# Patient Record
Sex: Female | Born: 1985 | Race: White | Hispanic: No | Marital: Married | Smoking: Never smoker
Health system: Southern US, Community
[De-identification: ages and names within clinical notes are randomized; demographics above are authoritative.]

## PROBLEM LIST (undated history)

## (undated) DIAGNOSIS — O139 Gestational [pregnancy-induced] hypertension without significant proteinuria, unspecified trimester: Secondary | ICD-10-CM

## (undated) DIAGNOSIS — E069 Thyroiditis, unspecified: Secondary | ICD-10-CM

## (undated) DIAGNOSIS — Q513 Bicornate uterus: Secondary | ICD-10-CM

## (undated) HISTORY — PX: WISDOM TOOTH EXTRACTION: SHX21

---

## 2015-08-19 ENCOUNTER — Encounter (HOSPITAL_COMMUNITY): Payer: Self-pay | Admitting: *Deleted

## 2015-08-19 ENCOUNTER — Inpatient Hospital Stay (HOSPITAL_COMMUNITY)
Admission: AD | Admit: 2015-08-19 | Discharge: 2015-08-22 | DRG: 766 | Disposition: A | Discharge status: Home / Self Care | Payer: BLUE CROSS/BLUE SHIELD | Source: Ambulatory Visit | Attending: Obstetrics and Gynecology | Admitting: Obstetrics and Gynecology

## 2015-08-19 ENCOUNTER — Inpatient Hospital Stay (HOSPITAL_COMMUNITY): Payer: BLUE CROSS/BLUE SHIELD | Admitting: Anesthesiology

## 2015-08-19 ENCOUNTER — Inpatient Hospital Stay (HOSPITAL_COMMUNITY)
Admission: AD | Disposition: A | Discharge status: Home / Self Care | Payer: BLUE CROSS/BLUE SHIELD | Source: Ambulatory Visit | Attending: Obstetrics and Gynecology

## 2015-08-19 DIAGNOSIS — O329XX0 Maternal care for malpresentation of fetus, unspecified, not applicable or unspecified: Secondary | ICD-10-CM | POA: Diagnosis present

## 2015-08-19 DIAGNOSIS — O139 Gestational [pregnancy-induced] hypertension without significant proteinuria, unspecified trimester: Secondary | ICD-10-CM | POA: Diagnosis present

## 2015-08-19 DIAGNOSIS — Z3A38 38 weeks gestation of pregnancy: Secondary | ICD-10-CM | POA: Diagnosis present

## 2015-08-19 DIAGNOSIS — O133 Gestational [pregnancy-induced] hypertension without significant proteinuria, third trimester: Principal | ICD-10-CM | POA: Diagnosis present

## 2015-08-19 DIAGNOSIS — O34219 Maternal care for unspecified type scar from previous cesarean delivery: Secondary | ICD-10-CM

## 2015-08-19 HISTORY — DX: Thyroiditis, unspecified: E06.9

## 2015-08-19 LAB — CBC
HCT: 35.2 % — ABNORMAL LOW (ref 36.0–46.0)
HEMOGLOBIN: 12.4 g/dL (ref 12.0–15.0)
MCH: 29.7 pg (ref 26.0–34.0)
MCHC: 35.2 g/dL (ref 30.0–36.0)
MCV: 84.4 fL (ref 78.0–100.0)
PLATELETS: 225 10*3/uL (ref 150–400)
RBC: 4.17 MIL/uL (ref 3.87–5.11)
RDW: 12.9 % (ref 11.5–15.5)
WBC: 8.6 10*3/uL (ref 4.0–10.5)

## 2015-08-19 LAB — TYPE AND SCREEN
ABO/RH(D): O POS
ANTIBODY SCREEN: NEGATIVE

## 2015-08-19 LAB — COMPREHENSIVE METABOLIC PANEL
ALK PHOS: 230 U/L — AB (ref 38–126)
ALT: 22 U/L (ref 14–54)
AST: 31 U/L (ref 15–41)
Albumin: 3.5 g/dL (ref 3.5–5.0)
Anion gap: 9 (ref 5–15)
BUN: 12 mg/dL (ref 6–20)
CALCIUM: 9.3 mg/dL (ref 8.9–10.3)
CHLORIDE: 104 mmol/L (ref 101–111)
CO2: 21 mmol/L — AB (ref 22–32)
CREATININE: 0.49 mg/dL (ref 0.44–1.00)
GFR calc Af Amer: >60 mL/min (ref >60)
GFR calc non Af Amer: >60 mL/min (ref >60)
GLUCOSE: 87 mg/dL (ref 65–99)
Potassium: 3.7 mmol/L (ref 3.5–5.1)
SODIUM: 134 mmol/L — AB (ref 135–145)
Total Bilirubin: 0.5 mg/dL (ref 0.3–1.2)
Total Protein: 7 g/dL (ref 6.5–8.1)

## 2015-08-19 LAB — PROTEIN / CREATININE RATIO, URINE
Creatinine, Urine: 71 mg/dL
Total Protein, Urine: <6 mg/dL

## 2015-08-19 SURGERY — Surgical Case
Anesthesia: Spinal

## 2015-08-19 MED ORDER — BUPIVACAINE HCL (PF) 0.5 % IJ SOLN: Freq: Once | INTRAMUSCULAR | Status: DC

## 2015-08-19 MED ORDER — NALBUPHINE HCL 10 MG/ML IJ SOLN
5.0000 mg | INTRAMUSCULAR | Status: DC | PRN
  Filled 2015-08-19: qty 0.5

## 2015-08-19 MED ORDER — SCOPOLAMINE 1 MG/3DAYS TD PT72
MEDICATED_PATCH | TRANSDERMAL | Status: DC | PRN
  Administered 2015-08-19: 1 via TRANSDERMAL

## 2015-08-19 MED ORDER — OXYTOCIN 40 UNITS IN LACTATED RINGERS INFUSION - SIMPLE MED: 62.5000 mL/h | INTRAVENOUS | Status: AC

## 2015-08-19 MED ORDER — TETANUS-DIPHTH-ACELL PERTUSSIS 5-2.5-18.5 LF-MCG/0.5 IM SUSP: 0.5000 mL | Freq: Once | INTRAMUSCULAR | Status: DC

## 2015-08-19 MED ORDER — KETOROLAC TROMETHAMINE 30 MG/ML IJ SOLN
INTRAMUSCULAR | Status: AC
  Filled 2015-08-19: qty 1

## 2015-08-19 MED ORDER — DIPHENHYDRAMINE HCL 25 MG PO CAPS: 25.0000 mg | ORAL_CAPSULE | ORAL | Status: DC | PRN

## 2015-08-19 MED ORDER — KETOROLAC TROMETHAMINE 30 MG/ML IJ SOLN
30.0000 mg | Freq: Three times a day (TID) | INTRAMUSCULAR | Status: DC | PRN
  Administered 2015-08-19: 30 mg via INTRAVENOUS

## 2015-08-19 MED ORDER — DIPHENHYDRAMINE HCL 25 MG PO CAPS: 25.0000 mg | ORAL_CAPSULE | Freq: Four times a day (QID) | ORAL | Status: DC | PRN

## 2015-08-19 MED ORDER — WITCH HAZEL-GLYCERIN EX PADS
1.0000 | MEDICATED_PAD | CUTANEOUS | Status: DC | PRN
Start: 2015-08-19 — End: 2015-08-22

## 2015-08-19 MED ORDER — KETOROLAC TROMETHAMINE 30 MG/ML IJ SOLN: 30.0000 mg | Freq: Four times a day (QID) | INTRAMUSCULAR | Status: AC | PRN

## 2015-08-19 MED ORDER — BUPIVACAINE LIPOSOME 1.3 % IJ SUSP
20.0000 mL | Freq: Once | INTRAMUSCULAR | Status: DC
  Filled 2015-08-19: qty 20

## 2015-08-19 MED ORDER — LACTATED RINGERS IV SOLN
INTRAVENOUS | Status: DC
  Administered 2015-08-19 (×2): via INTRAVENOUS

## 2015-08-19 MED ORDER — CITRIC ACID-SODIUM CITRATE 334-500 MG/5ML PO SOLN: 30.0000 mL | Freq: Once | ORAL | Status: DC

## 2015-08-19 MED ORDER — DIPHENHYDRAMINE HCL 50 MG/ML IJ SOLN: 12.5000 mg | INTRAMUSCULAR | Status: DC | PRN

## 2015-08-19 MED ORDER — SENNOSIDES-DOCUSATE SODIUM 8.6-50 MG PO TABS
2.0000 | ORAL_TABLET | ORAL | Status: DC
  Administered 2015-08-21 – 2015-08-22 (×2): 2 via ORAL
  Filled 2015-08-19 (×2): qty 2

## 2015-08-19 MED ORDER — OXYTOCIN 10 UNIT/ML IJ SOLN
40.0000 [IU] | INTRAVENOUS | Status: DC | PRN
  Administered 2015-08-19: 40 [IU] via INTRAVENOUS

## 2015-08-19 MED ORDER — OXYCODONE-ACETAMINOPHEN 5-325 MG PO TABS: 2.0000 | ORAL_TABLET | ORAL | Status: DC | PRN

## 2015-08-19 MED ORDER — CEFAZOLIN SODIUM-DEXTROSE 2-3 GM-% IV SOLR
INTRAVENOUS | Status: DC | PRN
  Administered 2015-08-19: 2 g via INTRAVENOUS

## 2015-08-19 MED ORDER — SIMETHICONE 80 MG PO CHEW: 80.0000 mg | CHEWABLE_TABLET | ORAL | Status: DC | PRN

## 2015-08-19 MED ORDER — MENTHOL 3 MG MT LOZG: 1.0000 | LOZENGE | OROMUCOSAL | Status: DC | PRN

## 2015-08-19 MED ORDER — DEXAMETHASONE SODIUM PHOSPHATE 4 MG/ML IJ SOLN
INTRAMUSCULAR | Status: DC | PRN
  Administered 2015-08-19: 4 mg via INTRAVENOUS

## 2015-08-19 MED ORDER — MORPHINE SULFATE (PF) 0.5 MG/ML IJ SOLN
INTRAMUSCULAR | Status: AC
  Filled 2015-08-19: qty 100

## 2015-08-19 MED ORDER — MORPHINE SULFATE (PF) 0.5 MG/ML IJ SOLN
INTRAMUSCULAR | Status: DC | PRN
  Administered 2015-08-19: .2 mg via INTRATHECAL

## 2015-08-19 MED ORDER — FENTANYL CITRATE (PF) 100 MCG/2ML IJ SOLN
INTRAMUSCULAR | Status: DC | PRN
  Administered 2015-08-19: 10 ug via INTRATHECAL

## 2015-08-19 MED ORDER — IBUPROFEN 600 MG PO TABS
600.0000 mg | ORAL_TABLET | Freq: Four times a day (QID) | ORAL | Status: DC
  Administered 2015-08-20 – 2015-08-22 (×9): 600 mg via ORAL
  Filled 2015-08-19 (×9): qty 1

## 2015-08-19 MED ORDER — PHENYLEPHRINE 8 MG IN D5W 100 ML (0.08MG/ML) PREMIX OPTIME
INJECTION | INTRAVENOUS | Status: AC
  Filled 2015-08-19: qty 100

## 2015-08-19 MED ORDER — CEFAZOLIN SODIUM-DEXTROSE 2-3 GM-% IV SOLR
INTRAVENOUS | Status: AC
  Filled 2015-08-19: qty 50

## 2015-08-19 MED ORDER — PRENATAL MULTIVITAMIN CH
1.0000 | ORAL_TABLET | Freq: Every day | ORAL | Status: DC
  Administered 2015-08-20 – 2015-08-21 (×2): 1 via ORAL
  Filled 2015-08-19 (×2): qty 1

## 2015-08-19 MED ORDER — LACTATED RINGERS IV SOLN
INTRAVENOUS | Status: DC
  Administered 2015-08-20: 06:00:00 via INTRAVENOUS

## 2015-08-19 MED ORDER — PHENYLEPHRINE 8 MG IN D5W 100 ML (0.08MG/ML) PREMIX OPTIME
INJECTION | INTRAVENOUS | Status: DC | PRN
  Administered 2015-08-19: 30 ug/min via INTRAVENOUS

## 2015-08-19 MED ORDER — ONDANSETRON HCL 4 MG/2ML IJ SOLN
INTRAMUSCULAR | Status: AC
  Filled 2015-08-19: qty 2

## 2015-08-19 MED ORDER — CITRIC ACID-SODIUM CITRATE 334-500 MG/5ML PO SOLN
ORAL | Status: AC
  Administered 2015-08-19: 30 mL
  Filled 2015-08-19: qty 15

## 2015-08-19 MED ORDER — NALOXONE HCL 0.4 MG/ML IJ SOLN: 0.4000 mg | INTRAMUSCULAR | Status: DC | PRN

## 2015-08-19 MED ORDER — PHENYLEPHRINE HCL 10 MG/ML IJ SOLN
INTRAMUSCULAR | Status: DC | PRN
  Administered 2015-08-19: 40 ug via INTRAVENOUS
  Administered 2015-08-19: 80 ug via INTRAVENOUS
  Administered 2015-08-19: 40 ug via INTRAVENOUS

## 2015-08-19 MED ORDER — FAMOTIDINE IN NACL 20-0.9 MG/50ML-% IV SOLN
INTRAVENOUS | Status: AC
  Filled 2015-08-19: qty 50

## 2015-08-19 MED ORDER — SIMETHICONE 80 MG PO CHEW
80.0000 mg | CHEWABLE_TABLET | Freq: Three times a day (TID) | ORAL | Status: DC
  Administered 2015-08-20 – 2015-08-22 (×6): 80 mg via ORAL
  Filled 2015-08-19 (×5): qty 1

## 2015-08-19 MED ORDER — SCOPOLAMINE 1 MG/3DAYS TD PT72
MEDICATED_PATCH | TRANSDERMAL | Status: AC
  Filled 2015-08-19: qty 1

## 2015-08-19 MED ORDER — BUPIVACAINE HCL (PF) 0.25 % IJ SOLN
INTRAMUSCULAR | Status: AC
  Filled 2015-08-19: qty 10

## 2015-08-19 MED ORDER — ZOLPIDEM TARTRATE 5 MG PO TABS: 5.0000 mg | ORAL_TABLET | Freq: Every evening | ORAL | Status: DC | PRN

## 2015-08-19 MED ORDER — SODIUM CHLORIDE 0.9 % IJ SOLN: 3.0000 mL | INTRAMUSCULAR | Status: DC | PRN

## 2015-08-19 MED ORDER — SIMETHICONE 80 MG PO CHEW
80.0000 mg | CHEWABLE_TABLET | ORAL | Status: DC
  Administered 2015-08-21 – 2015-08-22 (×2): 80 mg via ORAL
  Filled 2015-08-19 (×2): qty 1

## 2015-08-19 MED ORDER — LANOLIN HYDROUS EX OINT: 1.0000 "application " | TOPICAL_OINTMENT | CUTANEOUS | Status: DC | PRN

## 2015-08-19 MED ORDER — DIBUCAINE 1 % RE OINT: 1.0000 "application " | TOPICAL_OINTMENT | RECTAL | Status: DC | PRN

## 2015-08-19 MED ORDER — NALBUPHINE HCL 10 MG/ML IJ SOLN
5.0000 mg | Freq: Once | INTRAMUSCULAR | Status: DC | PRN
  Filled 2015-08-19: qty 0.5

## 2015-08-19 MED ORDER — FENTANYL CITRATE (PF) 100 MCG/2ML IJ SOLN: 25.0000 ug | INTRAMUSCULAR | Status: DC | PRN

## 2015-08-19 MED ORDER — ONDANSETRON HCL 4 MG/2ML IJ SOLN
INTRAMUSCULAR | Status: DC | PRN
  Administered 2015-08-19: 4 mg via INTRAVENOUS

## 2015-08-19 MED ORDER — MEPERIDINE HCL 25 MG/ML IJ SOLN: 6.2500 mg | INTRAMUSCULAR | Status: DC | PRN

## 2015-08-19 MED ORDER — ONDANSETRON HCL 4 MG/2ML IJ SOLN: 4.0000 mg | Freq: Three times a day (TID) | INTRAMUSCULAR | Status: DC | PRN

## 2015-08-19 MED ORDER — NALOXONE HCL 1 MG/ML IJ SOLN
1.0000 ug/kg/h | INTRAVENOUS | Status: DC | PRN
  Filled 2015-08-19: qty 2

## 2015-08-19 MED ORDER — OXYCODONE-ACETAMINOPHEN 5-325 MG PO TABS
1.0000 | ORAL_TABLET | ORAL | Status: DC | PRN
  Administered 2015-08-20: 1 via ORAL
  Filled 2015-08-19: qty 1

## 2015-08-19 MED ORDER — FENTANYL CITRATE (PF) 100 MCG/2ML IJ SOLN
INTRAMUSCULAR | Status: AC
  Filled 2015-08-19: qty 4

## 2015-08-19 MED ORDER — SCOPOLAMINE 1 MG/3DAYS TD PT72
1.0000 | MEDICATED_PATCH | Freq: Once | TRANSDERMAL | Status: DC
  Filled 2015-08-19: qty 1

## 2015-08-19 MED ORDER — BUPIVACAINE IN DEXTROSE 0.75-8.25 % IT SOLN
INTRATHECAL | Status: DC | PRN
  Administered 2015-08-19: 1.8 mL via INTRATHECAL

## 2015-08-19 MED ORDER — ONDANSETRON HCL 4 MG/2ML IJ SOLN: 4.0000 mg | Freq: Once | INTRAMUSCULAR | Status: DC | PRN

## 2015-08-19 MED ORDER — ACETAMINOPHEN 325 MG PO TABS
650.0000 mg | ORAL_TABLET | ORAL | Status: DC | PRN
  Administered 2015-08-20: 650 mg via ORAL
  Filled 2015-08-19: qty 2

## 2015-08-19 MED ORDER — OXYTOCIN 10 UNIT/ML IJ SOLN
INTRAMUSCULAR | Status: AC
  Filled 2015-08-19: qty 4

## 2015-08-19 MED ORDER — FAMOTIDINE IN NACL 20-0.9 MG/50ML-% IV SOLN
20.0000 mg | Freq: Once | INTRAVENOUS | Status: AC
  Administered 2015-08-19: 20 mg via INTRAVENOUS

## 2015-08-19 SURGICAL SUPPLY — 30 items
CLAMP CORD UMBIL (MISCELLANEOUS) IMPLANT
CLOTH BEACON ORANGE TIMEOUT ST (SAFETY) ×2 IMPLANT
DERMABOND ADVANCED (GAUZE/BANDAGES/DRESSINGS) ×1
DERMABOND ADVANCED .7 DNX12 (GAUZE/BANDAGES/DRESSINGS) ×1 IMPLANT
DRAPE SHEET LG 3/4 BI-LAMINATE (DRAPES) IMPLANT
DRSG OPSITE POSTOP 4X10 (GAUZE/BANDAGES/DRESSINGS) ×4 IMPLANT
DURAPREP 26ML APPLICATOR (WOUND CARE) ×2 IMPLANT
ELECT REM PT RETURN 9FT ADLT (ELECTROSURGICAL) ×2
ELECTRODE REM PT RTRN 9FT ADLT (ELECTROSURGICAL) ×1 IMPLANT
EXTRACTOR VACUUM M CUP 4 TUBE (SUCTIONS) IMPLANT
GLOVE BIO SURGEON STRL SZ7 (GLOVE) ×2 IMPLANT
GOWN STRL REUS W/TWL LRG LVL3 (GOWN DISPOSABLE) ×4 IMPLANT
KIT ABG SYR 3ML LUER SLIP (SYRINGE) IMPLANT
LIQUID BAND (GAUZE/BANDAGES/DRESSINGS) IMPLANT
NEEDLE HYPO 22GX1.5 SAFETY (NEEDLE) IMPLANT
NEEDLE HYPO 25X5/8 SAFETYGLIDE (NEEDLE) IMPLANT
NS IRRIG 1000ML POUR BTL (IV SOLUTION) ×2 IMPLANT
PACK C SECTION WH (CUSTOM PROCEDURE TRAY) ×2 IMPLANT
PAD OB MATERNITY 4.3X12.25 (PERSONAL CARE ITEMS) ×2 IMPLANT
PENCIL SMOKE EVAC W/HOLSTER (ELECTROSURGICAL) ×2 IMPLANT
RTRCTR C-SECT PINK 25CM LRG (MISCELLANEOUS) ×2 IMPLANT
SUT CHROMIC 1 CTX 36 (SUTURE) ×6 IMPLANT
SUT CHROMIC 2 0 CT 1 (SUTURE) ×2 IMPLANT
SUT PDS AB 0 CTX 60 (SUTURE) ×2 IMPLANT
SUT VIC AB 2-0 CT1 27 (SUTURE) ×1
SUT VIC AB 2-0 CT1 TAPERPNT 27 (SUTURE) ×1 IMPLANT
SUT VIC AB 4-0 KS 27 (SUTURE) ×2 IMPLANT
SYR 30ML LL (SYRINGE) IMPLANT
TOWEL OR 17X24 6PK STRL BLUE (TOWEL DISPOSABLE) ×2 IMPLANT
TRAY FOLEY CATH SILVER 14FR (SET/KITS/TRAYS/PACK) ×2 IMPLANT

## 2015-08-19 NOTE — MAU Provider Note (Signed)
  History   G1 @ 38.2 wks sent from office for elevated BP's. Denies headache, epigastric pain or visual disturbances.   CSN: [id number]  Arrival date and time: 08/19/15 2725   First Pierson Vantol Initiated Contact with Patient 08/19/15 1819      No chief complaint on file.  HPI  OB History    Gravida Para Term Preterm AB TAB SAB Ectopic Multiple Living   2         0      Past Medical History  Diagnosis Date  . Thyroiditis     Past Surgical History  Procedure Laterality Date  . Wisdom tooth extraction      History reviewed. No pertinent family history.  Social History  Substance Use Topics  . Smoking status: Never Smoker   . Smokeless tobacco: None  . Alcohol Use: Yes     Comment: not while pregnant    Allergies: No Known Allergies  Prescriptions prior to admission  Medication Sig Dispense Refill Last Dose  . Prenatal Vit-Fe Fumarate-FA (PRENATAL MULTIVITAMIN) TABS tablet Take 1 tablet by mouth daily at 12 noon.   08/18/2015 at Unknown time    Review of Systems  Constitutional: Negative.   HENT: Negative.   Eyes: Negative.   Respiratory: Negative.   Cardiovascular: Negative.   Gastrointestinal: Negative.   Genitourinary: Negative.   Musculoskeletal: Negative.   Skin: Negative.   Neurological: Negative.   Endo/Heme/Allergies: Negative.   Psychiatric/Behavioral: Negative.    Physical Exam   There were no vitals taken for this visit.  Physical Exam  Constitutional: She is oriented to person, place, and time. She appears well-developed and well-nourished.  HENT:  Head: Normocephalic.  Eyes: Pupils are equal, round, and reactive to light.  Neck: Normal range of motion.  Cardiovascular: Normal rate, regular rhythm, normal heart sounds and intact distal pulses.   Respiratory: Effort normal and breath sounds normal.  GI: Soft. Bowel sounds are normal.  Genitourinary: Vagina normal and uterus normal.  Musculoskeletal: Normal range of motion.  Neurological:  She is alert and oriented to person, place, and time. She has normal reflexes.  Skin: Skin is warm and dry.  Psychiatric: She has a normal mood and affect. Her behavior is normal. Judgment and thought content normal.    MAU Course  Procedures  MDM GHTN at 38.2  Assessment and Plan  PIH labs drawn. Dr. Harrington Challenger notified of admission. Dr. Harrington Challenger here and pt to go for C/S  Koren Shiver [employer] 08/19/2015, 6:22 PM

## 2015-08-19 NOTE — Anesthesia Preprocedure Evaluation (Signed)
Anesthesia Evaluation  Patient identified by MRN, date of birth, ID band Patient awake    Reviewed: Allergy & Precautions, NPO status , Patient's Chart, lab work & pertinent test results  History of Anesthesia Complications Negative for: history of anesthetic complications  Airway Mallampati: II  TM Distance: >3 FB Neck ROM: Full    Dental no notable dental hx. (+) Dental Advisory Given   Pulmonary neg pulmonary ROS,    Pulmonary exam normal breath sounds clear to auscultation       Cardiovascular hypertension (PreE), negative cardio ROS Normal cardiovascular exam Rhythm:Regular Rate:Normal     Neuro/Psych negative neurological ROS  negative psych ROS   GI/Hepatic negative GI ROS, Neg liver ROS,   Endo/Other  negative endocrine ROS  Renal/GU negative Renal ROS  negative genitourinary   Musculoskeletal negative musculoskeletal ROS (+)   Abdominal   Peds negative pediatric ROS (+)  Hematology negative hematology ROS (+)   Anesthesia Other Findings   Reproductive/Obstetrics (+) Pregnancy                             Anesthesia Physical Anesthesia Plan  ASA: III and emergent  Anesthesia Plan: Spinal   Post-op Pain Management:    Induction:   Airway Management Planned:   Additional Equipment:   Intra-op Plan:   Post-operative Plan:   Informed Consent: I have reviewed the patients History and Physical, chart, labs and discussed the procedure including the risks, benefits and alternatives for the proposed anesthesia with the patient or authorized representative who has indicated his/her understanding and acceptance.   Dental advisory given  Plan Discussed with:   Anesthesia Plan Comments: (Urgent C/S called for PreE and fetal decel)        Anesthesia Quick Evaluation

## 2015-08-19 NOTE — Anesthesia Procedure Notes (Signed)
Spinal Patient location during procedure: OR Staffing Anesthesiologist: Quita Mcgrory Performed by: anesthesiologist  Preanesthetic Checklist Completed: patient identified, site marked, surgical consent, pre-op evaluation, timeout performed, IV checked, risks and benefits discussed and monitors and equipment checked Spinal Block Patient position: sitting Prep: DuraPrep Patient monitoring: heart rate, continuous pulse ox and blood pressure Approach: midline Location: L3-4 Injection technique: single-shot Needle Needle type: Spinocan and Sprotte  Needle gauge: 24 G Needle length: 9 cm Additional Notes Expiration date of kit checked and confirmed. Patient tolerated procedure well, without complications.  Functioning IV was confirmed and monitors were applied. Sterile prep and drape, including hand hygiene, mask and sterile gloves were used. The patient was positioned and the spine was prepped. The skin was anesthetized with lidocaine.  Free flow of clear CSF was obtained prior to injecting local anesthetic into the CSF.  The spinal needle aspirated freely following injection.  The needle was carefully withdrawn.  The patient tolerated the procedure well. Consent was obtained prior to procedure with all questions answered and concerns addressed. Risks including but not limited to bleeding, infection, nerve damage, paralysis, failed block, inadequate analgesia, allergic reaction, high spinal, itching and headache were discussed and the patient wished to proceed.   Lauretta Grill, MD

## 2015-08-19 NOTE — Anesthesia Postprocedure Evaluation (Signed)
  Anesthesia Post-op Note  Patient: Stacy Wilcox  Procedure(s) Performed: Procedure(s) with comments: CESAREAN SECTION (N/A) - Keela RNFA confirmed-kah  Patient Location: PACU  Anesthesia Type:Spinal  Level of Consciousness: awake  Airway and Oxygen Therapy: Patient Spontanous Breathing  Post-op Pain: none  Post-op Assessment: Post-op Vital signs reviewed, Patient's Cardiovascular Status Stable, Respiratory Function Stable, Patent Airway, No signs of Nausea or vomiting and Pain level controlled LLE Motor Response: Purposeful movement LLE Sensation: Tingling RLE Motor Response: Non-purposeful movement RLE Sensation: Tingling L Sensory Level: L5-Outer lower leg, top of foot, great toe R Sensory Level: L5-Outer lower leg, top of foot, great toe  Post-op Vital Signs: Reviewed and stable  Last Vitals:  Filed Vitals:   08/19/15 2130  BP: 106/92  Pulse: 83  Temp:   Resp: 17    Complications: No apparent anesthesia complications

## 2015-08-19 NOTE — MAU Note (Signed)
House, nursery, Economist notified

## 2015-08-19 NOTE — Op Note (Signed)
Pre-Operative Diagnosis: 1) 38+2 week intrauterine pregnancy 2) Gestational hypertension 3) breech presentation 4) mullerian abnormality of uterus 5) fetal deceleration  Postoperative Diagnosis: 1) Same Procedure: Primary cesarean section Surgeon: Dr. Vanessa Kick Assistant: None Operative Findings: Female infant in frank breech presentation with apgars at 6 at 1 minute and 9 at 5 minutes. Uterus heart-shaped appearing. Unable to find a right horn of the uterus consistent with a complete longitudinal uterine septum Specimen: Placenta to pathology EBL: Total I/O In: 1100 [I.V.:1100] Out: 800 [Urine:100; Blood:700]   Procedure:Stacy Wilcox is an 29 year old gravida 2 para 0010 at 48 weeks and 2 days estimated gestational age who presents for cesarean section. 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 She was known to be breech and was scheduled for cesarean. During her routine prenatal visit today she was found to have elevated blood pressures 150/100 and 140/100. She was sent to the hospital for evaluation. While in Maternity admissions the fetal tracing demonstrated a spontaneous deceleration to the 90s for 6 minutes. Given this the decision was made to proceed with cesarean. Following the appropriate informed consent the patient was brought to the operating room where spinal anesthesia was administered and found to be adequate. She was placed in the dorsal supine position with a leftward tilt. She was prepped and draped in the normal sterile fashion. Scalpel was then used to make a Pfannenstiel skin incision which was carried down to the underlying layers of soft tissue to the fascia. The fascia was incised in the midline and the fascial incision was extended laterally with Mayo scissors. The superior aspect of the fascial incision was grasped with Coker clamps x2, tented up and the  rectus muscles dissected off sharply with the electrocautery unit area and the same procedure was repeated on the inferior aspect of the fascial incision. The rectus muscles were separated in the midline. The abdominal peritoneum was identified, tented up, entered sharply, and the incision was extended superiorly and inferiorly with good visualization of the bladder. The Alexis retractor was then deployed. The vesicouterine peritoneum was identified, tented up, entered sharply, and the bladder flap was created digitally. Scalpel was then used to make a low transverse incision on the uterus which was extended laterally with blunt dissection. The fetal breech was identified, delivered easily through the uterine incision followed by the body. The infant was bulb suctioned on the operative field, cord was clamped and cut and the infant was passed to the waiting neonatologist. Placenta was then delivered spontaneously, the uterus was cleared of all clot and debris. The uterine incision was repaired with #1 chromic in running locked fashion followed by a second imbricating layer. Ovaries and tubes were inspected and normal. The Alexis retractor was removed. The abdominal peritoneum was reapproximated with 2-0 Vicryl in a running fashion, the rectus muscles was reapproximated with #1 chromic in a running fashion. The fascia was closed with a looped PDS in a running fashion. The skin was closed with 4-0 vicryl in a subcuticular fashion and surgical skin glue. All sponge lap and needle counts were correct x2. Patient tolerated the procedure well and recovered in stable condition following the procedure.

## 2015-08-19 NOTE — H&P (Signed)
Stacy Wilcox is a 29 y.o. female presenting for hypertension  29 yo G1 po @ 38+2 presents for evaluation of elevated blood pressures. While in MAU she experienced a spontaneous deceleration in the fetal heart rate to 90 for 6 minutes. Given this the decision was made to proceed with cesarean section. R/B/A reviewed with the patient and she wishes to proceed.  Prior to pregnancy the patient participated in a HIV vaccine research study. When routine HIV antibody screening is performed se screens positive. Confirmatory testing is negatve for HIV History OB History    Gravida Para Term Preterm AB TAB SAB Ectopic Multiple Living   2         0     Past Medical History  Diagnosis Date  . Thyroiditis    Past Surgical History  Procedure Laterality Date  . Wisdom tooth extraction     Family History: family history is not on file. Social History:  reports that she has never smoked. She does not have any smokeless tobacco history on file. She reports that she drinks alcohol. She reports that she does not use illicit drugs.   Prenatal Transfer Tool  Maternal Diabetes: No Genetic Screening: Normal Maternal Ultrasounds/Referrals: Normal Fetal Ultrasounds or other Referrals:  None Maternal Substance Abuse:  No Significant Maternal Medications:  None Significant Maternal Lab Results:  None Other Comments:  None  ROS: as above    Blood pressure 133/92, pulse 89, temperature 98.5 F (36.9 C), temperature source Oral, resp. rate 16, height 5\' 4"  (1.626 m). Exam Physical Exam  Prenatal labs: ABO, Rh:    Antibody:   Rubella:   RPR:    HBsAg:    HIV:    GBS:     Assessment/Plan: 1) Admit 2) Proceed with cesarean section   Stacy Wilcox H. 08/19/2015, 6:55 PM

## 2015-08-19 NOTE — Transfer of Care (Signed)
Immediate Anesthesia Transfer of Care Note  Patient: Stacy Wilcox  Procedure(s) Performed: Procedure(s) with comments: CESAREAN SECTION (N/A) - Keela RNFA confirmed-kah  Patient Location: PACU  Anesthesia Type:Spinal  Level of Consciousness: awake, alert  and oriented  Airway & Oxygen Therapy: Patient Spontanous Breathing  Post-op Assessment: Report given to RN and Post -op Vital signs reviewed and stable  Post vital signs: Reviewed and stable  Last Vitals:  Filed Vitals:   08/19/15 1845  BP: 133/92  Pulse: 89  Temp:   Resp:     Complications: No apparent anesthesia complications

## 2015-08-20 ENCOUNTER — Encounter (HOSPITAL_COMMUNITY): Payer: Self-pay | Admitting: Obstetrics and Gynecology

## 2015-08-20 LAB — CBC
HEMATOCRIT: 29.4 % — AB (ref 36.0–46.0)
Hemoglobin: 10.3 g/dL — ABNORMAL LOW (ref 12.0–15.0)
MCH: 29.5 pg (ref 26.0–34.0)
MCHC: 35 g/dL (ref 30.0–36.0)
MCV: 84.2 fL (ref 78.0–100.0)
PLATELETS: 175 10*3/uL (ref 150–400)
RBC: 3.49 MIL/uL — ABNORMAL LOW (ref 3.87–5.11)
RDW: 12.7 % (ref 11.5–15.5)
WBC: 14.8 10*3/uL — AB (ref 4.0–10.5)

## 2015-08-20 LAB — ABO/RH: ABO/RH(D): O POS

## 2015-08-20 NOTE — Anesthesia Postprocedure Evaluation (Signed)
  Anesthesia Post-op Note  Patient: Stacy Wilcox  Procedure(s) Performed: Procedure(s) with comments: CESAREAN SECTION (N/A) - [place] RNFA confirmed-kah  Patient Location: Mother/Baby  Anesthesia Type:Spinal  Level of Consciousness: awake, alert , oriented and patient cooperative  Airway and Oxygen Therapy: Patient Spontanous Breathing  Post-op Pain: mild  Post-op Assessment: Patient's Cardiovascular Status Stable, Respiratory Function Stable, No headache, No backache and Patient able to bend at knees  Post-op Vital Signs: Reviewed and stable  Last Vitals:  Filed Vitals:   08/20/15 0514  BP: 124/80  Pulse: 70  Temp: 36.7 C  Resp: 18    Complications: No apparent anesthesia complications

## 2015-08-20 NOTE — Addendum Note (Signed)
Addendum  created 08/20/15 0747 by Georgeanne Nim, CRNA   Modules edited: Notes Section   Notes Section:  File: [id number]

## 2015-08-20 NOTE — Lactation Note (Signed)
This note was copied from the chart of [place]. Lactation Consultation Note  Patient Name: Stacy Wilcox [id number] Date: 08/20/2015 Reason for consult: Initial assessment Baby at 51 hr old, currently STS, only bf 2 times with 6 attempts. Baby is very sleepy, just circumcised. Mom can demonstrate manual expression, colostrum noted. Went over breast changes, milk transitions, feeding cues, waking sleepy baby, belly size, and feeding frequency. Aware of support groups and O/P lactation. Will make RN aware that mom might need to pump.    Maternal Data    Feeding    LATCH Score/Interventions Latch: Too sleepy or reluctant, no latch achieved, no sucking elicited. (this was an attempt, baby very sleepy ) Intervention(s): Skin to skin;Waking techniques Intervention(s): Breast massage  Audible Swallowing: None Intervention(s): Hand expression                 Lactation Tools Discussed/Used WIC Program: No   Consult Status Consult Status: Follow-up Date: 08/21/15 Follow-up type: In-patient    Denzil Hughes 08/20/2015, 4:46 PM

## 2015-08-20 NOTE — Progress Notes (Signed)
Patient is eating, ambulating, voiding.  Pain control is good.  Appropriate lochia.  No complaints.  Filed Vitals:   08/20/15 0514 08/20/15 0911 08/20/15 1340 08/20/15 1708  BP: 124/80 110/73 115/77 112/73  Pulse: 70 67 69 77  Temp: 98.1 F (36.7 C) 98.2 F (36.8 C) 98.3 F (36.8 C) 97.9 F (36.6 C)  TempSrc: Oral Oral  Oral  Resp: 18 18 18 18   Height:      Weight:      SpO2: 98% 99%      [employer]: c/d/i Ext: no CT  Lab Results  Component Value Date   WBC 14.8* 08/20/2015   HGB 10.3* 08/20/2015   HCT 29.4* 08/20/2015   MCV 84.2 08/20/2015   PLT 175 08/20/2015    --/--/O POS, O POS (09/26 1828)  A/P Post op day #1 s/p c/s: GHTN, breech, mullarian anomaly, NRFHT Normal BPs today, asymptomatic Circ desired, consent obtained.  Routine care.    Stacy Wilcox

## 2015-08-20 NOTE — Progress Notes (Signed)
Patient ambulated to bathroom well.  When she stood up to walk back to bed, the patient became dizzy.  RN sat patient back down on toilet & pulled the emergency cord.  Staff responded with stedy and ammonia.  Patient put in stedy and taken back to bed.  Halfway back patient did pass out, but responded to ammonia and was able to get back in bed with staff assistance.  Patient felt much better once back in bed.  BP, HR stable.

## 2015-08-21 LAB — RPR: RPR Ser Ql: NONREACTIVE

## 2015-08-21 MED ORDER — IBUPROFEN 600 MG PO TABS: 600.0000 mg | ORAL_TABLET | Freq: Four times a day (QID) | ORAL | Status: DC

## 2015-08-21 MED ORDER — OXYCODONE-ACETAMINOPHEN 5-325 MG PO TABS: 1.0000 | ORAL_TABLET | Freq: Four times a day (QID) | ORAL | Status: DC | PRN

## 2015-08-21 NOTE — Lactation Note (Signed)
This note was copied from the chart of [place]. Lactation Consultation Note  Spoke with Dr. Algie Coffer via phone. Discussed concerns with [name] feeding behaviors with only 4 BF and 4 attempts and that he is very sleepy and difficult to awaken to feed. Discussed that supplements are 3-5 cc EBM x 4 and that volume is below recommended supplemental volumes. Dr. Algie Coffer said he felt like infant looked good on exam this am, had adequate output and had acceptable bilirubin levels and that he stands behind the decision that child can be discharged but if parents want to stay then the baby can stay until tomorrow.   Patient Name: Stacy Wilcox Date: 08/21/2015 Reason for consult: Follow-up assessment;Infant < 6lbs   Maternal Data Formula Feeding for Exclusion: No Has patient been taught Hand Expression?: Yes Does the patient have breastfeeding experience prior to this delivery?: No  Feeding Feeding Type: Breast Milk  LATCH Score/Interventions                      Lactation Tools Discussed/Used WIC Program: No   Consult Status Consult Status: Follow-up Date: 08/21/15 Follow-up type: In-patient    Debby Freiberg Hice 08/21/2015, 1:06 PM

## 2015-08-21 NOTE — Lactation Note (Signed)
This note was copied from the chart of [place]. Lactation Consultation Note  Spoke with parents about concerns about Stacy Wilcox being D/C home with sleepiness and < 8 feeds in 24 hours. Discussed that I spoke with Dr. Algie Coffer and that he is agrees that Stacy Wilcox can still be D/C with  Follow up on Friday at [facility] Office but that if parents wish to stay they can stay for another night. Gave parents Feeding amount chart for amounts needed if infant not willing to BF. Parents voiced understanding and are to make decision on whether they will go home today or not. Mom says her breasts feel a little fuller today and are feeing warm. Told parents that I would like to see an actual BF prior to D/C. Mom says it is time for him to eat. We awakened Stacy Wilcox and changed his diaper. He awakened but did not wish to feed. Mom easily expressed BM and used awakening techniques to keep infant awake. Stacy Wilcox did not latch. Enc mom to pump and I would return to show her how to finger feed infant using 5 fr feeding tube. Also requested that DEBP be rented and parents to return for OP appointment next week due to sleepiness and poor feeding. Parents agreed to plan of care.       Patient Name: Stacy Wilcox [id number] Date: 08/21/2015 Reason for consult: Follow-up assessment;Infant < 6lbs   Maternal Data Formula Feeding for Exclusion: No Has patient been taught Hand Expression?: Yes Does the patient have breastfeeding experience prior to this delivery?: No  Feeding Feeding Type: Breast Milk  LATCH Score/Interventions                      Lactation Tools Discussed/Used WIC Program: No   Consult Status Consult Status: Follow-up Date: 08/21/15 Follow-up type: In-patient    Debby Freiberg Hice 08/21/2015, 1:56 PM

## 2015-08-21 NOTE — Discharge Summary (Signed)
Obstetric Discharge Summary Reason for Admission: cesarean section Prenatal Procedures: none Intrapartum Procedures: cesarean: low cervical, transverse Postpartum Procedures: none Complications-Operative and Postpartum: none HEMOGLOBIN  Date Value Ref Range Status  08/20/2015 10.3* 12.0 - 15.0 g/dL Final   HCT  Date Value Ref Range Status  08/20/2015 29.4* 36.0 - 46.0 % Final    Physical Exam:  General: alert, cooperative and appears stated age 29: appropriate Uterine Fundus: firm Incision: healing well, no significant drainage DVT Evaluation: No evidence of DVT seen on physical exam.  Discharge Diagnoses: Term Pregnancy-delivered  Discharge Information: Date: 08/21/2015 Activity: pelvic rest Diet: routine Medications: PNV, Ibuprofen, Iron and Percocet Condition: stable Instructions: refer to practice specific booklet Discharge to: home Follow-up Information    Follow up with Marcial Pacas., MD In 4 weeks.   Specialty:  Obstetrics and Gynecology   Contact information:   [place] [address] [phone]       Newborn Data: Live born female  Birth Weight: 6 lb 4.7 oz (2855 g) APGAR: 6, 9  Home with mother.  [place] 08/21/2015, 8:58 AM

## 2015-08-21 NOTE — Progress Notes (Signed)
Patient is eating, ambulating, voiding.  Pain control is good.  Appropriate lochia.  No complaints. Still working on establishing breastfeeding  Filed Vitals:   08/20/15 0911 08/20/15 1340 08/20/15 1708 08/21/15 0600  BP: 110/73 115/77 112/73 120/76  Pulse: 67 69 77 73  Temp: 98.2 F (36.8 C) 98.3 F (36.8 C) 97.9 F (36.6 C) 98.5 F (36.9 C)  TempSrc: Oral  Oral   Resp: 18 18 18 16   Height:      Weight:      SpO2: 99%       [employer]: c/d/i Ext: no CT  Lab Results  Component Value Date   WBC 14.8* 08/20/2015   HGB 10.3* 08/20/2015   HCT 29.4* 08/20/2015   MCV 84.2 08/20/2015   PLT 175 08/20/2015    --/--/O POS, O POS (09/26 1828)/ rubella immune  A/P Post op day #2 s/p c/s: GHTN, breech, mullarian anomaly Normal BPs today, asymptomatic Desires to be discharged if breastfeeding improves today, otherwise will stay until tomorrow Meeting all goals  [place]

## 2015-08-21 NOTE — Discharge Instructions (Signed)
You may wash incision with soap and water.   Do not soak the incision for 2 weeks (no tub baths or swimming).   Keep incision dry. You may need to keep a sanitary pad or panty liner between the incision and your clothing for comfort and to keep the incision dry.  If you note drainage, increased pain, or increased redness of the incision, then please notify your physician.  Pelvic rest x 6 weeks (no intercourse or tampons)   No lifting over 10 lbs for 6 weeks.   Do not drive until you are not taking narcotic pain medication AND you can comfortably slam on the brakes.  You have a mild anemia due to blood loss from delivery.  Please continue to take a prenatal vitamin daily.  You may experience constipation due to the prenatal vitamin and percocet, so add a stool softener like colace if needed.

## 2015-08-21 NOTE — Lactation Note (Signed)
This note was copied from the chart of [place]. Lactation Consultation Note  Called Dr. Algie Coffer at [facility] to discuss infant feedings and concerns for allowing early D/C.  Patient Name: Stacy Wilcox [id number] Date: 08/21/2015 Reason for consult: Follow-up assessment;Infant < 6lbs   Maternal Data Formula Feeding for Exclusion: No Has patient been taught Hand Expression?: Yes Does the patient have breastfeeding experience prior to this delivery?: No  Feeding Feeding Type: Breast Milk  LATCH Score/Interventions                      Lactation Tools Discussed/Used WIC Program: No   Consult Status Consult Status: Follow-up Date: 08/21/15 Follow-up type: In-patient    Debby Freiberg Erron Wengert 08/21/2015, 1:05 PM

## 2015-08-21 NOTE — Lactation Note (Signed)
This note was copied from the chart of Stacy Wilcox. Lactation Consultation Note  Follow up consultation with parent prior to D/C at 41 hours of age. Mom is a G1 C/S and plans to go home today. Infant noted to have 4 BF in last 24 hours with 4 BF . He has had 3 voids and 6 stools in 24 hours and has had a 5% weight loss at 28 hours of age. He has received 4 supplementations of 3-5cc EBM when he was too sleepy to feed. Mom reports that infant has been sleepy and is difficult to feed, she is aware of awakening techniques and is using them. She is able to hand express milk. Mom is waking baby q 2-3 hours and last BF at 0430. Infant asleep in crib currently. Mom has a hand pump to go home with. Discussed with parents that a hand pump is not adequate to stimulate a supply in light of the fact that infant is not feeding well. Enc mom to use DEBP to pump pc or in place of feeding when infant not wanting to feed, then hand express afterwards and all EBM should be given to baby. Since infant not feeding well, plan is to call MD to discuss plan of care.   Patient Name: Stacy Wilcox BBCWU'G Date: 08/21/2015 Reason for consult: Follow-up assessment;Infant < 6lbs   Maternal Data Formula Feeding for Exclusion: No Has patient been taught Hand Expression?: Yes Does the patient have breastfeeding experience prior to this delivery?: No  Feeding Feeding Type: Breast Milk Length of feed: 0 min  LATCH Score/Interventions Latch: Too sleepy or reluctant, no latch achieved, no sucking elicited. Intervention(s): Skin to skin;Teach feeding cues;Waking techniques Intervention(s): Adjust position;Assist with latch;Breast compression  Audible Swallowing: None Intervention(s): Skin to skin;Hand expression Intervention(s): Skin to skin;Hand expression  Type of Nipple: Everted at rest and after stimulation  Comfort (Breast/Nipple): Soft / non-tender     Hold (Positioning): No assistance needed to  correctly position infant at breast. Intervention(s): Breastfeeding basics reviewed;Support Pillows;Position options;Skin to skin  LATCH Score: 6  Lactation Tools Discussed/Used WIC Program: No Pump Review: Setup, frequency, and cleaning;Milk Storage Initiated by:: A Eisma RN Date initiated:: 08/21/15   Consult Status Consult Status: Follow-up Date: 08/21/15 Follow-up type: In-patient    Debby Freiberg Hice 08/21/2015, 12:46 PM

## 2015-08-21 NOTE — Lactation Note (Signed)
This note was copied from the chart of [place]. Lactation Consultation Note  Did discuss with parents that depending on weight tonight, infant may need to be supplemented with formula until milk volume increases.   Patient Name: Boy Kindall Swaby VUYEB'X Date: 08/21/2015 Reason for consult: Follow-up assessment;Infant < 6lbs   Maternal Data Formula Feeding for Exclusion: No Has patient been taught Hand Expression?: Yes Does the patient have breastfeeding experience prior to this delivery?: No  Feeding Feeding Type: Breast Fed Length of feed: 0 min  LATCH Score/Interventions Latch: Too sleepy or reluctant, no latch achieved, no sucking elicited. Intervention(s): Skin to skin;Waking techniques Intervention(s): Breast massage;Breast compression  Audible Swallowing: None Intervention(s): Skin to skin;Hand expression Intervention(s): Skin to skin  Type of Nipple: Everted at rest and after stimulation  Comfort (Breast/Nipple): Filling, red/small blisters or bruises, mild/mod discomfort  Problem noted: Mild/Moderate discomfort Interventions (Filling): Double electric pump Interventions (Mild/moderate discomfort): Hand expression  Hold (Positioning): No assistance needed to correctly position infant at breast. Intervention(s): Breastfeeding basics reviewed;Support Pillows;Position options;Skin to skin  LATCH Score: 5  Lactation Tools Discussed/Used Tools: Pump;15F feeding tube / Syringe Breast pump type: Double-Electric Breast Pump WIC Program: No Pump Review: Setup, frequency, and cleaning;Milk Storage   Consult Status Consult Status: Follow-up Date: 08/22/15 Follow-up type: In-patient    Debby Freiberg Hice 08/21/2015, 4:07 PM

## 2015-08-21 NOTE — Lactation Note (Signed)
This note was copied from the chart of [place]. Lactation Consultation Note  Called back to room after mom pumped. She pumped 1 cc EBM and hand expressed 1 cc EBM. Mom did say she was watching pump while pumping. Enc her to cover pump and relax while pumping.  Stacy Wilcox is showing feeding cues at this time. Mom latched him onto right breast without assistance after several tries. He nursed with sucking bursts and needed encouragement to maintain sucking. There were a few intermittent swallows. Mom massaged/compressed breast during entire feeding. He unlatched himself after 18 minutes. Showed dad how to finger feed Stacy Wilcox the 2 cc EBM vis 5 fr feeding tube and syringe. Parents to talk about whether they want to go home today or stay another day.   Patient Name: [name] VUYEB'X Date: 08/21/2015 Reason for consult: Follow-up assessment;Infant < 6lbs   Maternal Data Formula Feeding for Exclusion: No Has patient been taught Hand Expression?: Yes Does the patient have breastfeeding experience prior to this delivery?: No  Feeding    LATCH Score/Interventions                      Lactation Tools Discussed/Used WIC Program: No   Consult Status Consult Status: Follow-up Date: 08/21/15 Follow-up type: In-patient    Debby Freiberg Hice 08/21/2015, 2:07 PM

## 2015-08-21 NOTE — Lactation Note (Signed)
This note was copied from the chart of [place]. Lactation Consultation Note  Called to room as infant ready to feed. Infant was awake and alert and grandmother was changing diaper. Infant placed to breast and he nuzzled a little and fell asleep. Mom to pump and dad to finger feed infant with 5 fr feeding tube. Parents have decided to stay another night to continue to work on feedings. Plan written and given to parents. Pt. Will need a 2 week pump rental before D/C. OP [place] appointment made for 08/27/15 @ 1 pm. Baby has Ped appt Friday afternoon.  Plan as follows: 1. BF infant 8-12 x in 24 hours at first feeding cues, awaken at 3 hours to feed if not awake 2. Pump using DEBP every 3 hours after BF or attempt for 15 minutes, follow pumping by hand expressing 3. Give baby all EBM expressed whether nurses at breast or not. 4. Massage/compress breasts during feeding to  And stimulate infant to stay awake at breast 5. Keep Feeding and output log and take to Ped visit on Friday 6. Outpatient LC appt 08/27/15 @ 1 pm  Parents to call PRN questions/concerns/assistance.    Patient Name: Stacy Wilcox POEUM'P Date: 08/21/2015 Reason for consult: Follow-up assessment;Infant < 6lbs   Maternal Data Formula Feeding for Exclusion: No Has patient been taught Hand Expression?: Yes Does the patient have breastfeeding experience prior to this delivery?: No  Feeding Feeding Type: Breast Fed Length of feed: 0 min  LATCH Score/Interventions Latch: Too sleepy or reluctant, no latch achieved, no sucking elicited. Intervention(s): Skin to skin;Waking techniques Intervention(s): Breast massage;Breast compression  Audible Swallowing: None Intervention(s): Skin to skin;Hand expression Intervention(s): Skin to skin  Type of Nipple: Everted at rest and after stimulation  Comfort (Breast/Nipple): Filling, red/small blisters or bruises, mild/mod discomfort  Problem noted: Mild/Moderate  discomfort Interventions (Filling): Double electric pump Interventions (Mild/moderate discomfort): Hand expression  Hold (Positioning): No assistance needed to correctly position infant at breast. Intervention(s): Breastfeeding basics reviewed;Support Pillows;Position options;Skin to skin  LATCH Score: 5  Lactation Tools Discussed/Used Tools: Pump;61F feeding tube / Syringe Breast pump type: Double-Electric Breast Pump WIC Program: No Pump Review: Setup, frequency, and cleaning;Milk Storage   Consult Status Consult Status: Follow-up Date: 08/22/15 Follow-up type: In-patient    Stacy Wilcox 08/21/2015, 3:52 PM

## 2015-08-21 NOTE — Lactation Note (Signed)
This note was copied from the chart of [place]. Lactation Consultation Note  Spoke with Dr. Berline Lopes at [facility]. Discussed concerns with [name] feeding behaviors with only 4 BF and 4 attempts and that he is very sleepy and difficult to awaken to feed. Discussed that supplements are 3-5 cc EBM x 4 and that volume is below recommended supplemental volumes.  She is going to speak with Dr. Algie Coffer and get back with me.  Patient Name: Stacy Wilcox IWLNL'G Date: 08/21/2015 Reason for consult: Follow-up assessment;Infant < 6lbs   Maternal Data Formula Feeding for Exclusion: No Has patient been taught Hand Expression?: Yes Does the patient have breastfeeding experience prior to this delivery?: No  Feeding Feeding Type: Breast Milk  LATCH Score/Interventions                      Lactation Tools Discussed/Used WIC Program: No   Consult Status Consult Status: Follow-up Date: 08/21/15 Follow-up type: In-patient    Debby Freiberg Hice 08/21/2015, 1:52 PM

## 2015-08-22 NOTE — Lactation Note (Signed)
This note was copied from the chart of [place]. Lactation Consultation Note  Patient Name: Stacy Wilcox KYHCW'C Date: 08/22/2015  Randel Books is 77 hours old , @ 8 % weight loss , > 6 pounds now,  Per mom was using #24 NS with the SNS , but hasn't been lately  Mom mentioned the baby is latching well with out it and if he isn't latching  Dad is finger feeding. Per mom the plan is working well that was given to her yesterday by the  [facility]. Last fed at 1000 25 ml finger feeding due to the baby not interested , voided and stooled at feeding. Sore nipple and engorgement prevention and tx reviewed .  LC reviewed written plan and mom and dad had a good understanding. LC stressed the fact that due to the 8 % weight loss, just 38 2/7 week baby , < 6 pounds now , baby needs  To feed at least every 3 hours. May need to give an appetizer. LC apt had been made 08/27/15 at 1 pm , parents aware. Mother informed of post-discharge support and given phone number to the lactation department, including services  for phone call assistance; out-patient appointments; and breastfeeding support group. List of other breastfeeding resources  in the community given in the handout. Encouraged mother to call for problems or concerns related to breastfeeding.      Maternal Data    Feeding    LATCH Score/Interventions                      Lactation Tools Discussed/Used     Consult Status      Stacy Wilcox 08/22/2015, 11:18 AM

## 2015-08-27 ENCOUNTER — Ambulatory Visit (HOSPITAL_COMMUNITY)
Admit: 2015-08-27 | Discharge: 2015-08-27 | Disposition: A | Discharge status: Home / Self Care | Payer: BLUE CROSS/BLUE SHIELD | Attending: Obstetrics and Gynecology | Admitting: Obstetrics and Gynecology

## 2015-08-27 ENCOUNTER — Inpatient Hospital Stay (HOSPITAL_COMMUNITY): Admission: RE | Admit: 2015-08-27 | Payer: BLUE CROSS/BLUE SHIELD | Source: Ambulatory Visit

## 2015-08-27 NOTE — Lactation Note (Addendum)
Lactation Consult  Mother's reason for visit:  Mom would like help with latch. Now pump/bottle feeding Visit Type:  Outpatient Appointment Notes:  Mom reports baby is not sustaining a latch at the breast. Very sleepy, will take 1-2 suckles then fall asleep with or without the nipple shield. She has been exclusively pump and bottle feeding. [name] is now 74 days old.  Consult:  Initial Lactation Consultant:  Katrine Coho  ________________________________________________________________________    5 Name: Stacy Wilcox Date of Birth: 08/19/2014 Pediatrician: Dr. Maisie Fus - [facility] Gender: female Gestational Age: [redacted]w[redacted]d (At Birth) Birth Weight: 6 lb 4.7 oz (2855 g) Weight at Discharge: Weight: 5 lb 12.2 oz (2615 g)Date of Discharge: 08/22/2015 Filed Weights   [birth year]/19/15 1948 08/21/15 0025 08/21/15 2345  Weight: 6 lb 4.7 oz (2855 g) 5 lb 15.9 oz (2720 g) 5 lb 12.2 oz (2615 g)   Last weight taken from location outside of [facility]: 08/23/15 5 lb 14.5 oz Location:Pediatrician's office Weight today: 6 lb. 6.4 oz/2902 gm.       ________________________________________________________________________  Mother's Name: Stacy Wilcox Type of delivery:  C/S  [place] Breastfeeding Experience:  P1 Maternal Medical Conditions:  Pregnancy induced hypertension, History of Thyroiditis Maternal Medications:  PNV  ________________________________________________________________________  Breastfeeding History (Post Discharge)  Frequency of breastfeeding:  Attempts 10-12 times per day, baby not sustaining a latch Duration of feeding:  Takes 1-2 suckles then falls asleep.   Supplementation  Breastmilk:  Volume: on average 2 1/2 oz(42ml) Frequency:  Every 2 hours during the day/every 4 hours at night   Method:  Bottle,   Pumping  Type of pump:  Symphony Frequency:  6 times/day Volume: 60-90 ml with each pumping  Infant Intake and  Output Assessment  Voids:  8+ in 24 hrs.  Color:  Clear yellow Stools:  8+ in 24 hrs.  Color:  Yellow  ________________________________________________________________________  Maternal Breast Assessment  Breast:  Soft Nipple:  Erect Pain level:  0   _______________________________________________________________________ Feeding Assessment/Evaluation  Initial feeding assessment:  Infant's oral assessment:  Variance. LC notes on exam that Baby has short labial frenulum preventing upper lip from remaining un-tucked with feeding. Short, lingual frenulum with some tongue restriction with movement. With sucking on LC finger, lower gum ridge rubbing, baby humps his tongue.   Positioning:  Football Right breast  LATCH documentation:  Latch:  1 = Repeated attempts needed to sustain latch, nipple held in mouth throughout feeding, stimulation needed to elicit sucking reflex.   Audible swallowing:  1 = A few with stimulation  Type of nipple:  2 = Everted at rest and after stimulation  Comfort (Breast/Nipple):  2 = Soft / non-tender  Hold (Positioning):  1 = Assistance needed to correctly position infant at breast and maintain latch  LATCH score:  7 Baby very sleepy and would not sustain a latch. Mom had been using #24 nipple shield at home but this was too large. Changed nipple shield to size 20 and after few attempts baby did develop a suckling pattern. However a lot of stimulation needed to keep baby active at the breast. Baby nursed for 10 minutes on/off. Baby would not sustain a good suckling pattern.   Attached assessment:  Shallow without the nipple shield. More depth obtained with nipple shield.   Lips flanged:  No.  Lips untucked:  No.  Suck assessment:  Displays both nutritive and non-nutritive suckling at the breast.   Tools:  Nipple shield 20 mm Instructed  on use and cleaning of tool:  Yes.    Pre-feed weight:  2902 g  (6 lb. 6.4 oz.) Post-feed weight:  2914 g (6 lb. 6.8  oz.) Amount transferred:  12 ml  Additional Feeding Assessment -   Infant's oral assessment:  Variance  Positioning:  Football Left breast  LATCH documentation:  Latch:  1 = Repeated attempts needed to sustain latch, nipple held in mouth throughout feeding, stimulation needed to elicit sucking reflex.  Audible swallowing:  1 = A few with stimulation  Type of nipple:  2 = Everted at rest and after stimulation  Comfort (Breast/Nipple):  2 = Soft / non-tender  Hold (Positioning):  1 = Assistance needed to correctly position infant at breast and maintain latch  LATCH score:  7 Same experience with this feeding. Baby very sleepy. A lot of stimulation for baby to take few suckles. Did obtain more depth with using #20 nipple shield. Some breast milk visible in the nipple shield with both feedings and parents report baby sustaining the latch much better with the nipple shield.   Attached assessment:  Shallow. More depth obtained with nipple shield  Lips flanged:  No.  Lips untucked:  No.  Suck assessment:  Displays both  Nutritive and non-nutritive suckling observed.   Tools:  Nipple shield 20 mm Instructed on use and cleaning of tool:  Yes.    Pre-feed weight:  2914 g  (6 lb. 6.8 oz.) Post-feed weight:  2918 g (6 lb. 6.9 oz.) Amount transferred:  4 ml Amount supplemented:  10 ml   Of EBM given via bottle  Total amount transferred:  16 ml Total supplement given:  10 ml  Parents report [name] has been very sleepy. Will have some awake periods but overall a sleepy baby. He is noted to have jaundice however parents report this has improved over the past few days. He was seen at [facility] on Friday, 08/23/15 and parents are putting him in indirect sunlight at home during the day. Jaundice noted on chest but more prevalent from the neck up. [name] is taking supplements regularly, gaining weight and has good output.  Parents were pleased that Stacy Wilcox did latch and demonstrate a suckling  pattern today. They report this was the best he has done at the breast since home.  Advised Mom to offer breast with each feeding using the #20 nipple shield, 8-12 times in 24 hours and with feeding ques. Try to keep [place] for 15-20 minutes, both breasts when possible. If he is too sleepy, also advised could BF on 1 breast each feeding for 15-30 minutes till  baby more awake/alert. If he becomes fussy and will not latch, give appetizer of EBM via bottle (approx 10 ml) and then offer breast to see if he will latch and work back to breast.  Mom to post pump every 3 hours for 15 minutes to have EBM to supplement and to protect milk supply. Continue supplements till we see then next week for another pre/post weight and till Stacy Wilcox is latching consistently, more awake/alert at breast.  Parents to call and schedule OP f/u after FOB checks his schedule.  Discussed frenulum with parents but advised baby was too sleepy today to determine if this is an issue yet with breastfeeding. Advised if once [name] is more awake/alert, nursing actively, Mom develops pain at the breast or he is not transferring milk well, the frenulum could be affecting the breastfeeding.

## 2015-09-18 ENCOUNTER — Other Ambulatory Visit: Payer: Self-pay | Admitting: Obstetrics and Gynecology

## 2015-09-19 LAB — CYTOLOGY - PAP

## 2016-05-24 ENCOUNTER — Encounter (HOSPITAL_COMMUNITY): Payer: Self-pay | Admitting: Emergency Medicine

## 2016-05-24 ENCOUNTER — Ambulatory Visit (HOSPITAL_COMMUNITY)
Admission: EM | Admit: 2016-05-24 | Discharge: 2016-05-24 | Disposition: A | Discharge status: Home / Self Care | Payer: BLUE CROSS/BLUE SHIELD | Attending: Emergency Medicine | Admitting: Emergency Medicine

## 2016-05-24 DIAGNOSIS — M62830 Muscle spasm of back: Secondary | ICD-10-CM | POA: Diagnosis not present

## 2016-05-24 LAB — POCT URINALYSIS DIP (DEVICE)
BILIRUBIN URINE: NEGATIVE
Glucose, UA: NEGATIVE mg/dL
HGB URINE DIPSTICK: NEGATIVE
KETONES UR: NEGATIVE mg/dL
Leukocytes, UA: NEGATIVE
Nitrite: NEGATIVE
PH: 7.5 (ref 5.0–8.0)
Protein, ur: 30 mg/dL — AB
Specific Gravity, Urine: 1.015 (ref 1.005–1.030)
Urobilinogen, UA: 0.2 mg/dL (ref 0.0–1.0)

## 2016-05-24 MED ORDER — CYCLOBENZAPRINE HCL 5 MG PO TABS: 5.0000 mg | ORAL_TABLET | Freq: Three times a day (TID) | ORAL | Status: DC | PRN

## 2016-05-24 NOTE — ED Provider Notes (Signed)
CSN: [id number]     Arrival date & time 05/24/16  1725 History   First MD Initiated Contact with Patient 05/24/16 1750     Chief Complaint  Patient presents with  . Back Pain   (Consider location/radiation/quality/duration/timing/severity/associated sxs/prior Treatment) HPI She is a 30 year old woman here for evaluation of back pain. She states that it started earlier today in her upper back. It has since moved to the mid back, at the bra line. It is bilateral. She reports normal range of motion without worsening the pain. She has tried extra strength Tylenol without improvement. She has had some nausea today and one episode of vomiting around 4:30 this afternoon. She states since vomiting her nausea has resolved. No fevers. No urinary symptoms. No abdominal pain. No known injury or trauma, but she does have a 71-month-old at home that she carries frequently.  Past Medical History  Diagnosis Date  . Thyroiditis    Past Surgical History  Procedure Laterality Date  . Wisdom tooth extraction    . Cesarean section N/A 08/19/2015    Procedure: CESAREAN SECTION;  Surgeon: Vanessa Kick, MD;  Location: [facility];  Service: Obstetrics;  Laterality: N/A;  Keela RNFA confirmed-kah   Family History  Problem Relation Age of Onset  . Hypertension Father    Social History  Substance Use Topics  . Smoking status: Never Smoker   . Smokeless tobacco: None  . Alcohol Use: Yes     Comment: not while pregnant   OB History    Gravida Para Term Preterm AB TAB SAB Ectopic Multiple Living   2 1 1       0 1     Review of Systems As in history of present illness Allergies  Review of patient's allergies indicates no known allergies.  Home Medications   Prior to Admission medications   Medication Sig Start Date End Date Taking? Authorizing Provider  acetaminophen (TYLENOL) 325 MG tablet Take 650 mg by mouth every 6 (six) hours as needed.   Yes Historical Provider, MD  cyclobenzaprine (FLEXERIL) 5 MG tablet  Take 1 tablet (5 mg total) by mouth 3 (three) times daily as needed for muscle spasms. 05/24/16   Melony Overly, MD  ibuprofen (ADVIL,MOTRIN) 600 MG tablet Take 1 tablet (600 mg total) by mouth every 6 (six) hours. 08/21/15   Jerelyn Charles, MD  oxyCODONE-acetaminophen (PERCOCET/ROXICET) 5-325 MG tablet Take 1 tablet by mouth every 6 (six) hours as needed for severe pain. 08/21/15   Jerelyn Charles, MD  Prenatal Vit-Fe Fumarate-FA (PRENATAL MULTIVITAMIN) TABS tablet Take 1 tablet by mouth daily at 12 noon.    Historical Provider, MD   Meds Ordered and Administered this Visit  Medications - No data to display  BP 114/70 mmHg  Pulse 106  Temp(Src) 98.4 F (36.9 C) (Oral)  Resp 18  SpO2 97%  LMP 05/07/2016  Breastfeeding? Unknown No data found.   Physical Exam  Constitutional: She is oriented to person, place, and time. She appears well-developed and well-nourished. No distress.  Cardiovascular: Normal rate.   Pulmonary/Chest: Effort normal.  Musculoskeletal:  Back: No erythema or edema. No vertebral tenderness or step-offs. No specific point tenderness. She does have some mild to moderate spasm in bilateral thoracic paraspinous muscles. 5 out of 5 strength in all 4 extremities. Sensation grossly intact throughout.  Neurological: She is alert and oriented to person, place, and time.    ED Course  Procedures (including critical care time)  Labs Review Labs Reviewed  POCT  URINALYSIS DIP (DEVICE) - Abnormal; Notable for the following:    Protein, ur 30 (*)    All other components within normal limits    Imaging Review No results found.   MDM   1. Muscle spasm of back    She has likely strained her back muscles between poor posture and having a 61-month-old baby. Conservative management with massage, heat, ibuprofen, and Flexeril. Follow-up as needed.    Melony Overly, MD 05/24/16 [phone]

## 2016-05-24 NOTE — Discharge Instructions (Signed)
You have some spasm in the muscles of your back. This is likely a result of poor posture and hauling a 56-month-old around. Take 600 mg of ibuprofen 3 times a day for the next 5 days. Apply heat at least 2-3 times a day for 20 minutes. Gentle massage will also be beneficial. You can use the Flexeril 3 times a day as needed for pain. I recommended it at bedtime as it will likely make you drowsy. Follow-up as needed.

## 2016-05-24 NOTE — ED Notes (Signed)
Back pain and nausea.  Reports initially pain was in upper back.  Now has pain in mid back, nausea, and one vomiting episode today.  Denies diarrhea, denies urinary symptoms.

## 2016-10-08 ENCOUNTER — Other Ambulatory Visit: Payer: Self-pay | Admitting: Obstetrics and Gynecology

## 2016-10-09 LAB — CYTOLOGY - PAP

## 2018-01-28 LAB — OB RESULTS CONSOLE RPR: RPR: NONREACTIVE

## 2018-01-28 LAB — OB RESULTS CONSOLE GC/CHLAMYDIA
CHLAMYDIA, DNA PROBE: NEGATIVE
GC PROBE AMP, GENITAL: NEGATIVE

## 2018-01-28 LAB — OB RESULTS CONSOLE ANTIBODY SCREEN: Antibody Screen: NEGATIVE

## 2018-01-28 LAB — OB RESULTS CONSOLE HEPATITIS B SURFACE ANTIGEN: Hepatitis B Surface Ag: NEGATIVE

## 2018-01-28 LAB — OB RESULTS CONSOLE RUBELLA ANTIBODY, IGM: RUBELLA: IMMUNE

## 2018-01-28 LAB — OB RESULTS CONSOLE ABO/RH: RH Type: POSITIVE

## 2018-01-28 LAB — OB RESULTS CONSOLE HIV ANTIBODY (ROUTINE TESTING): HIV: REACTIVE

## 2018-06-27 ENCOUNTER — Other Ambulatory Visit: Payer: Self-pay | Admitting: Obstetrics and Gynecology

## 2018-07-13 LAB — OB RESULTS CONSOLE GBS: STREP GROUP B AG: NEGATIVE

## 2018-07-20 ENCOUNTER — Telehealth (HOSPITAL_COMMUNITY): Payer: Self-pay | Admitting: *Deleted

## 2018-07-20 ENCOUNTER — Encounter (HOSPITAL_COMMUNITY): Payer: Self-pay | Admitting: *Deleted

## 2018-07-20 NOTE — Telephone Encounter (Signed)
Preadmission screen  

## 2018-07-21 ENCOUNTER — Encounter (HOSPITAL_COMMUNITY): Payer: Self-pay

## 2018-08-02 ENCOUNTER — Encounter (HOSPITAL_COMMUNITY)
Admission: RE | Admit: 2018-08-02 | Discharge: 2018-08-02 | Disposition: A | Discharge status: Home / Self Care | Payer: 59 | Source: Ambulatory Visit | Attending: Obstetrics and Gynecology | Admitting: Obstetrics and Gynecology

## 2018-08-02 LAB — CBC
HEMATOCRIT: 34.2 % — AB (ref 36.0–46.0)
Hemoglobin: 11.5 g/dL — ABNORMAL LOW (ref 12.0–15.0)
MCH: 27.6 pg (ref 26.0–34.0)
MCHC: 33.6 g/dL (ref 30.0–36.0)
MCV: 82 fL (ref 78.0–100.0)
Platelets: 215 10*3/uL (ref 150–400)
RBC: 4.17 MIL/uL (ref 3.87–5.11)
RDW: 13.1 % (ref 11.5–15.5)
WBC: 7.3 10*3/uL (ref 4.0–10.5)

## 2018-08-02 LAB — TYPE AND SCREEN
ABO/RH(D): O POS
Antibody Screen: NEGATIVE

## 2018-08-02 NOTE — Patient Instructions (Signed)
Stacy Wilcox  08/02/2018   Your procedure is scheduled on:  08/04/2018  Enter through the Main Entrance of [facility] [place] up the phone at the desk and dial [phone]  Call this number if you have problems the morning of [phone]  Remember:   Do not eat food:(After Midnight) Desps de medianoche.  Do not drink clear liquids: (After Midnight) Desps de medianoche.  Take these medicines the morning of surgery with A SIP OF WATER: none   Do not wear jewelry, make-up or nail polish.  Do not wear lotions, powders, or perfumes. Do not wear deodorant.  Do not shave 48 hours prior to surgery.  Do not bring valuables to the hospital.  [facility] is not   responsible for any belongings or valuables brought to the hospital.  Contacts, dentures or bridgework may not be worn into surgery.  Leave suitcase in the car. After surgery it may be brought to your room.  For patients admitted to the hospital, checkout time is 11:00 AM the day of              discharge.    N/A   Please read over the following fact sheets that you were given:   Surgical Site Infection Prevention

## 2018-08-03 ENCOUNTER — Encounter (HOSPITAL_COMMUNITY)
Admission: RE | Admit: 2018-08-03 | Discharge: 2018-08-03 | Disposition: A | Discharge status: Home / Self Care | Payer: 59 | Source: Ambulatory Visit | Attending: Obstetrics and Gynecology | Admitting: Obstetrics and Gynecology

## 2018-08-03 HISTORY — DX: Gestational (pregnancy-induced) hypertension without significant proteinuria, unspecified trimester: O13.9

## 2018-08-03 HISTORY — DX: Bicornate uterus: Q51.3

## 2018-08-04 ENCOUNTER — Encounter (HOSPITAL_COMMUNITY): Payer: Self-pay | Admitting: *Deleted

## 2018-08-04 ENCOUNTER — Encounter (HOSPITAL_COMMUNITY)
Admission: RE | Disposition: A | Discharge status: Home / Self Care | Payer: Self-pay | Source: Home / Self Care | Attending: Obstetrics and Gynecology

## 2018-08-04 ENCOUNTER — Inpatient Hospital Stay (HOSPITAL_COMMUNITY)
Admission: RE | Admit: 2018-08-04 | Discharge: 2018-08-06 | DRG: 788 | Disposition: A | Discharge status: Home / Self Care | Payer: 59 | Attending: Obstetrics and Gynecology | Admitting: Obstetrics and Gynecology

## 2018-08-04 ENCOUNTER — Inpatient Hospital Stay (HOSPITAL_COMMUNITY): Payer: 59 | Admitting: Anesthesiology

## 2018-08-04 DIAGNOSIS — Q513 Bicornate uterus: Secondary | ICD-10-CM

## 2018-08-04 DIAGNOSIS — O34211 Maternal care for low transverse scar from previous cesarean delivery: Principal | ICD-10-CM | POA: Diagnosis present

## 2018-08-04 DIAGNOSIS — O3403 Maternal care for unspecified congenital malformation of uterus, third trimester: Secondary | ICD-10-CM | POA: Diagnosis present

## 2018-08-04 DIAGNOSIS — O321XX0 Maternal care for breech presentation, not applicable or unspecified: Secondary | ICD-10-CM | POA: Diagnosis present

## 2018-08-04 DIAGNOSIS — Z3A39 39 weeks gestation of pregnancy: Secondary | ICD-10-CM | POA: Diagnosis not present

## 2018-08-04 LAB — RPR: RPR Ser Ql: NONREACTIVE

## 2018-08-04 SURGERY — Surgical Case
Anesthesia: Spinal

## 2018-08-04 MED ORDER — ONDANSETRON HCL 4 MG/2ML IJ SOLN: 4.0000 mg | Freq: Three times a day (TID) | INTRAMUSCULAR | Status: DC | PRN

## 2018-08-04 MED ORDER — DIBUCAINE 1 % RE OINT: 1.0000 "application " | TOPICAL_OINTMENT | RECTAL | Status: DC | PRN

## 2018-08-04 MED ORDER — NALOXONE HCL 0.4 MG/ML IJ SOLN: 0.4000 mg | INTRAMUSCULAR | Status: DC | PRN

## 2018-08-04 MED ORDER — ZOLPIDEM TARTRATE 5 MG PO TABS: 5.0000 mg | ORAL_TABLET | Freq: Every evening | ORAL | Status: DC | PRN

## 2018-08-04 MED ORDER — MORPHINE SULFATE (PF) 0.5 MG/ML IJ SOLN
INTRAMUSCULAR | Status: DC | PRN
  Administered 2018-08-04: .15 mg via INTRATHECAL

## 2018-08-04 MED ORDER — FENTANYL CITRATE (PF) 100 MCG/2ML IJ SOLN
INTRAMUSCULAR | Status: AC
  Filled 2018-08-04: qty 2

## 2018-08-04 MED ORDER — MORPHINE SULFATE (PF) 0.5 MG/ML IJ SOLN
INTRAMUSCULAR | Status: AC
  Filled 2018-08-04: qty 10

## 2018-08-04 MED ORDER — NALBUPHINE HCL 10 MG/ML IJ SOLN: 5.0000 mg | Freq: Once | INTRAMUSCULAR | Status: DC | PRN

## 2018-08-04 MED ORDER — DIPHENHYDRAMINE HCL 50 MG/ML IJ SOLN: 12.5000 mg | INTRAMUSCULAR | Status: DC | PRN

## 2018-08-04 MED ORDER — KETOROLAC TROMETHAMINE 30 MG/ML IJ SOLN
30.0000 mg | Freq: Four times a day (QID) | INTRAMUSCULAR | Status: AC | PRN
  Administered 2018-08-04: 30 mg via INTRAMUSCULAR

## 2018-08-04 MED ORDER — NALBUPHINE HCL 10 MG/ML IJ SOLN: 5.0000 mg | INTRAMUSCULAR | Status: DC | PRN

## 2018-08-04 MED ORDER — METHYLERGONOVINE MALEATE 0.2 MG/ML IJ SOLN: 0.2000 mg | INTRAMUSCULAR | Status: DC | PRN

## 2018-08-04 MED ORDER — CEFAZOLIN SODIUM-DEXTROSE 2-4 GM/100ML-% IV SOLN
2.0000 g | INTRAVENOUS | Status: AC
  Administered 2018-08-04: 2 g via INTRAVENOUS

## 2018-08-04 MED ORDER — FENTANYL CITRATE (PF) 100 MCG/2ML IJ SOLN
INTRAMUSCULAR | Status: DC | PRN
  Administered 2018-08-04: 150 ug via INTRATHECAL

## 2018-08-04 MED ORDER — MEPERIDINE HCL 25 MG/ML IJ SOLN: 6.2500 mg | INTRAMUSCULAR | Status: DC | PRN

## 2018-08-04 MED ORDER — KETOROLAC TROMETHAMINE 30 MG/ML IJ SOLN: 30.0000 mg | Freq: Four times a day (QID) | INTRAMUSCULAR | Status: AC | PRN

## 2018-08-04 MED ORDER — OXYCODONE-ACETAMINOPHEN 5-325 MG PO TABS: 1.0000 | ORAL_TABLET | ORAL | Status: DC | PRN

## 2018-08-04 MED ORDER — WITCH HAZEL-GLYCERIN EX PADS: 1.0000 "application " | MEDICATED_PAD | CUTANEOUS | Status: DC | PRN

## 2018-08-04 MED ORDER — TETANUS-DIPHTH-ACELL PERTUSSIS 5-2.5-18.5 LF-MCG/0.5 IM SUSP: 0.5000 mL | Freq: Once | INTRAMUSCULAR | Status: DC

## 2018-08-04 MED ORDER — DEXAMETHASONE SODIUM PHOSPHATE 4 MG/ML IJ SOLN
INTRAMUSCULAR | Status: AC
  Filled 2018-08-04: qty 1

## 2018-08-04 MED ORDER — ONDANSETRON HCL 4 MG/2ML IJ SOLN
INTRAMUSCULAR | Status: AC
  Filled 2018-08-04: qty 2

## 2018-08-04 MED ORDER — LACTATED RINGERS IV SOLN
INTRAVENOUS | Status: DC | PRN
  Administered 2018-08-04: 08:00:00 via INTRAVENOUS

## 2018-08-04 MED ORDER — KETOROLAC TROMETHAMINE 30 MG/ML IJ SOLN
INTRAMUSCULAR | Status: AC
  Filled 2018-08-04: qty 1

## 2018-08-04 MED ORDER — IBUPROFEN 600 MG PO TABS
600.0000 mg | ORAL_TABLET | Freq: Four times a day (QID) | ORAL | Status: DC
  Administered 2018-08-04 – 2018-08-06 (×8): 600 mg via ORAL
  Filled 2018-08-04 (×8): qty 1

## 2018-08-04 MED ORDER — OXYTOCIN 10 UNIT/ML IJ SOLN
INTRAVENOUS | Status: DC | PRN
  Administered 2018-08-04: 40 [IU] via INTRAVENOUS

## 2018-08-04 MED ORDER — PROMETHAZINE HCL 25 MG/ML IJ SOLN: 6.2500 mg | INTRAMUSCULAR | Status: DC | PRN

## 2018-08-04 MED ORDER — SIMETHICONE 80 MG PO CHEW: 80.0000 mg | CHEWABLE_TABLET | ORAL | Status: DC | PRN

## 2018-08-04 MED ORDER — COCONUT OIL OIL
1.0000 "application " | TOPICAL_OIL | Status: DC | PRN
  Administered 2018-08-04: 1 via TOPICAL
  Filled 2018-08-04: qty 120

## 2018-08-04 MED ORDER — BUPIVACAINE IN DEXTROSE 0.75-8.25 % IT SOLN
INTRATHECAL | Status: DC | PRN
  Administered 2018-08-04: 1.6 mL via INTRATHECAL

## 2018-08-04 MED ORDER — OXYTOCIN 40 UNITS IN LACTATED RINGERS INFUSION - SIMPLE MED: 2.5000 [IU]/h | INTRAVENOUS | Status: AC

## 2018-08-04 MED ORDER — METHYLERGONOVINE MALEATE 0.2 MG PO TABS: 0.2000 mg | ORAL_TABLET | ORAL | Status: DC | PRN

## 2018-08-04 MED ORDER — DIPHENHYDRAMINE HCL 25 MG PO CAPS: 25.0000 mg | ORAL_CAPSULE | Freq: Four times a day (QID) | ORAL | Status: DC | PRN

## 2018-08-04 MED ORDER — ONDANSETRON HCL 4 MG/2ML IJ SOLN
INTRAMUSCULAR | Status: DC | PRN
  Administered 2018-08-04: 4 mg via INTRAVENOUS

## 2018-08-04 MED ORDER — PHENYLEPHRINE 8 MG IN D5W 100 ML (0.08MG/ML) PREMIX OPTIME
INJECTION | INTRAVENOUS | Status: AC
  Filled 2018-08-04: qty 100

## 2018-08-04 MED ORDER — DIPHENHYDRAMINE HCL 25 MG PO CAPS: 25.0000 mg | ORAL_CAPSULE | ORAL | Status: DC | PRN

## 2018-08-04 MED ORDER — OXYTOCIN 10 UNIT/ML IJ SOLN
INTRAMUSCULAR | Status: AC
  Filled 2018-08-04: qty 4

## 2018-08-04 MED ORDER — DEXAMETHASONE SODIUM PHOSPHATE 10 MG/ML IJ SOLN
INTRAMUSCULAR | Status: DC | PRN
  Administered 2018-08-04: 4 mg via INTRAVENOUS

## 2018-08-04 MED ORDER — KETOROLAC TROMETHAMINE 30 MG/ML IJ SOLN: 30.0000 mg | Freq: Once | INTRAMUSCULAR | Status: DC | PRN

## 2018-08-04 MED ORDER — SIMETHICONE 80 MG PO CHEW
80.0000 mg | CHEWABLE_TABLET | Freq: Three times a day (TID) | ORAL | Status: DC
  Administered 2018-08-04 – 2018-08-06 (×6): 80 mg via ORAL
  Filled 2018-08-04 (×6): qty 1

## 2018-08-04 MED ORDER — LACTATED RINGERS IV SOLN
INTRAVENOUS | Status: DC
  Administered 2018-08-04: 18:00:00 via INTRAVENOUS

## 2018-08-04 MED ORDER — PRENATAL MULTIVITAMIN CH
1.0000 | ORAL_TABLET | Freq: Every day | ORAL | Status: DC
  Administered 2018-08-05 – 2018-08-06 (×2): 1 via ORAL
  Filled 2018-08-04 (×2): qty 1

## 2018-08-04 MED ORDER — OXYCODONE-ACETAMINOPHEN 5-325 MG PO TABS: 2.0000 | ORAL_TABLET | ORAL | Status: DC | PRN

## 2018-08-04 MED ORDER — PHENYLEPHRINE 8 MG IN D5W 100 ML (0.08MG/ML) PREMIX OPTIME
INJECTION | INTRAVENOUS | Status: DC | PRN
  Administered 2018-08-04: 50 ug/min via INTRAVENOUS

## 2018-08-04 MED ORDER — MENTHOL 3 MG MT LOZG: 1.0000 | LOZENGE | OROMUCOSAL | Status: DC | PRN

## 2018-08-04 MED ORDER — ACETAMINOPHEN 500 MG PO TABS
1000.0000 mg | ORAL_TABLET | Freq: Four times a day (QID) | ORAL | Status: AC
  Administered 2018-08-04 – 2018-08-05 (×4): 1000 mg via ORAL
  Filled 2018-08-04 (×5): qty 2

## 2018-08-04 MED ORDER — HYDROMORPHONE HCL 1 MG/ML IJ SOLN: 0.2500 mg | INTRAMUSCULAR | Status: DC | PRN

## 2018-08-04 MED ORDER — SIMETHICONE 80 MG PO CHEW
80.0000 mg | CHEWABLE_TABLET | ORAL | Status: DC
  Administered 2018-08-05 (×2): 80 mg via ORAL
  Filled 2018-08-04 (×2): qty 1

## 2018-08-04 MED ORDER — LACTATED RINGERS IV SOLN
INTRAVENOUS | Status: DC
  Administered 2018-08-04 (×3): via INTRAVENOUS

## 2018-08-04 MED ORDER — SODIUM CHLORIDE 0.9% FLUSH: 3.0000 mL | INTRAVENOUS | Status: DC | PRN

## 2018-08-04 MED ORDER — NALOXONE HCL 4 MG/10ML IJ SOLN: 1.0000 ug/kg/h | INTRAVENOUS | Status: DC | PRN

## 2018-08-04 MED ORDER — ACETAMINOPHEN 325 MG PO TABS: 650.0000 mg | ORAL_TABLET | ORAL | Status: DC | PRN

## 2018-08-04 MED ORDER — SCOPOLAMINE 1 MG/3DAYS TD PT72: 1.0000 | MEDICATED_PATCH | Freq: Once | TRANSDERMAL | Status: DC

## 2018-08-04 MED ORDER — SENNOSIDES-DOCUSATE SODIUM 8.6-50 MG PO TABS
2.0000 | ORAL_TABLET | ORAL | Status: DC
  Administered 2018-08-05 (×2): 2 via ORAL
  Filled 2018-08-04 (×2): qty 2

## 2018-08-04 SURGICAL SUPPLY — 30 items
CHLORAPREP W/TINT 26ML (MISCELLANEOUS) ×2 IMPLANT
CLAMP CORD UMBIL (MISCELLANEOUS) IMPLANT
CLOTH BEACON ORANGE TIMEOUT ST (SAFETY) ×2 IMPLANT
DERMABOND ADVANCED (GAUZE/BANDAGES/DRESSINGS) ×1
DERMABOND ADVANCED .7 DNX12 (GAUZE/BANDAGES/DRESSINGS) ×1 IMPLANT
DRSG OPSITE POSTOP 4X10 (GAUZE/BANDAGES/DRESSINGS) ×2 IMPLANT
ELECT REM PT RETURN 9FT ADLT (ELECTROSURGICAL) ×2
ELECTRODE REM PT RTRN 9FT ADLT (ELECTROSURGICAL) ×1 IMPLANT
EXTRACTOR VACUUM M CUP 4 TUBE (SUCTIONS) IMPLANT
GLOVE BIO SURGEON STRL SZ7 (GLOVE) ×2 IMPLANT
GLOVE BIOGEL PI IND STRL 7.0 (GLOVE) ×1 IMPLANT
GLOVE BIOGEL PI INDICATOR 7.0 (GLOVE) ×1
GOWN STRL REUS W/TWL LRG LVL3 (GOWN DISPOSABLE) ×4 IMPLANT
KIT ABG SYR 3ML LUER SLIP (SYRINGE) IMPLANT
NEEDLE HYPO 22GX1.5 SAFETY (NEEDLE) IMPLANT
NEEDLE HYPO 25X5/8 SAFETYGLIDE (NEEDLE) IMPLANT
NS IRRIG 1000ML POUR BTL (IV SOLUTION) ×2 IMPLANT
PACK C SECTION WH (CUSTOM PROCEDURE TRAY) ×2 IMPLANT
PAD OB MATERNITY 4.3X12.25 (PERSONAL CARE ITEMS) ×2 IMPLANT
PENCIL SMOKE EVAC W/HOLSTER (ELECTROSURGICAL) ×2 IMPLANT
RTRCTR C-SECT PINK 25CM LRG (MISCELLANEOUS) ×2 IMPLANT
SUT CHROMIC 1 CTX 36 (SUTURE) ×4 IMPLANT
SUT CHROMIC 2 0 CT 1 (SUTURE) ×2 IMPLANT
SUT PDS AB 0 CTX 60 (SUTURE) ×2 IMPLANT
SUT VIC AB 2-0 CT1 27 (SUTURE) ×1
SUT VIC AB 2-0 CT1 TAPERPNT 27 (SUTURE) ×1 IMPLANT
SUT VIC AB 4-0 KS 27 (SUTURE) IMPLANT
SYR 30ML LL (SYRINGE) IMPLANT
TOWEL OR 17X24 6PK STRL BLUE (TOWEL DISPOSABLE) ×2 IMPLANT
TRAY FOLEY W/BAG SLVR 14FR LF (SET/KITS/TRAYS/PACK) ×2 IMPLANT

## 2018-08-04 NOTE — Addendum Note (Signed)
Addendum  created 08/04/18 1036 by Bufford Spikes, CRNA   Intraprocedure Meds edited

## 2018-08-04 NOTE — Anesthesia Postprocedure Evaluation (Signed)
Anesthesia Post Note  Patient: Stacy Wilcox  Procedure(s) Performed: REPEAT CESAREAN SECTION (N/A )     Patient location during evaluation: PACU Anesthesia Type: Spinal Level of consciousness: awake Pain management: pain level controlled Vital Signs Assessment: post-procedure vital signs reviewed and stable Respiratory status: spontaneous breathing Cardiovascular status: stable Postop Assessment: no headache, no backache, spinal receding, patient able to bend at knees and no apparent nausea or vomiting Anesthetic complications: no    Last Vitals:  Vitals:   08/04/18 0935 08/04/18 0950  BP:  121/81  Pulse: 86 75  Resp: 17 17  Temp:  36.4 C  SpO2: 100% 99%    Last Pain:  Vitals:   08/04/18 0950  TempSrc: Oral   Pain Goal:                 Ming Kunka JR,JOHN Adraine Biffle

## 2018-08-04 NOTE — Addendum Note (Signed)
Addendum  created 08/04/18 1531 by Asher Muir, CRNA   Sign clinical note

## 2018-08-04 NOTE — Lactation Note (Signed)
This note was copied from a baby's chart. Lactation Consultation Note  Patient Name: Girl Stacy Wilcox [id number] Date: 08/04/2018 Reason for consult: Term;Initial assessment  21 hours old FT female who is being exclusively BF by her mother, she's a P2 but not very experienced BF. She exclusively pumped and bottle for 3 months for her first child, he never latched. She has a Doctor, general practice DEBP at home. Mom is familiar with hand expression and so far she's been able to see some colostrum already.  Offered assistance with latch but mom politely declined, baby was having his hearing test. Asked mom to call for latch assistance when needed. Per mom feedings at the breast are comfortable and she's able to hear baby swallowing at the breast.   Plan:  1. Encouraged mom to feed baby STS 8-12 times/24 hours or sooner if feeding cues are present 2. Hand expression and spoon feeding was also encouraged  Reviewed BF brochure, BF resources and feeding diary, both parents are aware of [facility] services and will call PRN.  Maternal Data Formula Feeding for Exclusion: No Has patient been taught Hand Expression?: Yes Does the patient have breastfeeding experience prior to this delivery?: Yes  Feeding Feeding Type: Breast Fed Length of feed: 0 min  Interventions Interventions: Breast feeding basics reviewed  Lactation Tools Discussed/Used WIC Program: No   Consult Status Consult Status: Follow-up Date: 08/05/18 Follow-up type: In-patient    Tishawn Friedhoff Francene Boyers 08/04/2018, 8:59 PM

## 2018-08-04 NOTE — Anesthesia Postprocedure Evaluation (Signed)
Anesthesia Post Note  Patient: Stacy Wilcox  Procedure(s) Performed: REPEAT CESAREAN SECTION (N/A )     Patient location during evaluation: Mother Baby Anesthesia Type: Spinal Level of consciousness: awake Pain management: satisfactory to patient Vital Signs Assessment: post-procedure vital signs reviewed and stable Respiratory status: spontaneous breathing Cardiovascular status: stable Anesthetic complications: no    Last Vitals:  Vitals:   08/04/18 1318 08/04/18 1322  BP: 130/89 (!) 131/92  Pulse: 72 76  Resp:    Temp:    SpO2:      Last Pain:  Vitals:   08/04/18 1315  TempSrc: Oral   Pain Goal:                 [employer]

## 2018-08-04 NOTE — Consult Note (Signed)
[facility]  Delivery Note:  C-section       08/04/2018  7:35 AM  I was called to the operating room at the request of the patient's obstetrician (Dr. Harrington Challenger) for a repeat c-section.  PRENATAL HX:  This is a 32 y/o G2P1001 at 12 and 1/[redacted] weeks gestation who was admitted for repeat c-section.  Her pregnancy has been uncomplicated. She has a history of taking part in an HIV vaccine trial in college and therefore HIV ELISA is positive but viral load is negative.  AROM at delivery  DELIVERY:  Infant was vigorous at delivery, requiring no resuscitation other than standard warming, drying and stimulation.  APGARs 8 and 9.  Exam within normal limits.  After 5 minutes, baby left with nurse to assist parents with skin-to-skin care.   _____________________ Electronically Signed By: Clinton Gallant, MD Neonatologist

## 2018-08-04 NOTE — Transfer of Care (Signed)
Immediate Anesthesia Transfer of Care Note  Patient: Stacy Wilcox  Procedure(s) Performed: REPEAT CESAREAN SECTION (N/A )  Patient Location: PACU  Anesthesia Type:Spinal  Level of Consciousness: awake, alert  and oriented  Airway & Oxygen Therapy: Patient Spontanous Breathing  Post-op Assessment: Report given to RN and Post -op Vital signs reviewed and stable  Post vital signs: Reviewed and stable  Last Vitals:  Vitals Value Taken Time  BP 89/78 08/04/2018  8:41 AM  Temp    Pulse 72 08/04/2018  8:42 AM  Resp 13 08/04/2018  8:42 AM  SpO2 96 % 08/04/2018  8:42 AM  Vitals shown include unvalidated device data.  Last Pain:  Vitals:   08/04/18 0550  TempSrc: Oral         Complications: No apparent anesthesia complications

## 2018-08-04 NOTE — H&P (Signed)
Stacy Wilcox is a 32 y.o. female presenting for scheduled repeat cesarean  32 yo G2P1001 @ 39+1 presents for a scheduled repeat cesarean section for history of prior c/s. Her pregnancy has been uncomplicated. She has a history of taking part in an HIV vaccine trial in college and therefore HIV ELISA is positive but viral load is negative OB History    Gravida  2   Para  1   Term  1   Preterm      AB      Living  1     SAB      TAB      Ectopic      Multiple  0   Live Births  1          Past Medical History:  Diagnosis Date  . Bicornate uterus    almost complete uterine septum  . Pregnancy induced hypertension   . Thyroiditis    Past Surgical History:  Procedure Laterality Date  . CESAREAN SECTION N/A 08/19/2015   Procedure: CESAREAN SECTION;  Surgeon: Vanessa Kick, MD;  Location: [facility];  Service: Obstetrics;  Laterality: N/A;  Keela RNFA confirmed-kah  . WISDOM TOOTH EXTRACTION     Family History: family history includes Colon cancer in her paternal grandmother; Hyperlipidemia in her father and paternal grandmother; Hypertension in her father and paternal grandmother; Skin cancer in her maternal grandfather; Thyroid disease in her maternal grandmother and mother. Social History:  reports that she has never smoked. She has never used smokeless tobacco. She reports that she drinks alcohol. She reports that she does not use drugs.     Maternal Diabetes: No Genetic Screening: Normal Maternal Ultrasounds/Referrals: Normal Fetal Ultrasounds or other Referrals:  None Maternal Substance Abuse:  No Significant Maternal Medications:  None Significant Maternal Lab Results:  None Other Comments:  None  ROS History   Blood pressure 123/85, pulse 99, temperature 98.4 F (36.9 C), temperature source Oral, resp. rate 16, height 5\' 4"  (1.626 m), weight 78 kg, last menstrual period 11/03/2017, unknown if currently breastfeeding. Exam Physical Exam  Prenatal  labs: ABO, Rh: --/--/O POS (09/10 1527) Antibody: NEG (09/10 1527) Rubella: Immune (03/08 0000) RPR: Non Reactive (09/10 1518)  HBsAg: Negative (03/08 0000)  HIV: Reactive (03/08 0000)  GBS: Negative (08/21 0000)   Assessment/Plan: 1) Admit 2) SCDS 3) Ancef OCTOR   Vanessa Kick 08/04/2018, 7:24 AM

## 2018-08-04 NOTE — Anesthesia Preprocedure Evaluation (Signed)
Anesthesia Evaluation  Patient identified by MRN, date of birth, ID band Patient awake    Reviewed: Allergy & Precautions, H&P , NPO status , Patient's Chart, lab work & pertinent test results  Airway Mallampati: I  TM Distance: >3 FB Neck ROM: full    Dental no notable dental hx. (+) Teeth Intact   Pulmonary neg pulmonary ROS,    Pulmonary exam normal breath sounds clear to auscultation       Cardiovascular hypertension, negative cardio ROS Normal cardiovascular exam Rhythm:regular Rate:Normal     Neuro/Psych negative neurological ROS  negative psych ROS   GI/Hepatic negative GI ROS, Neg liver ROS,   Endo/Other  negative endocrine ROS  Renal/GU negative Renal ROS     Musculoskeletal   Abdominal Normal abdominal exam  (+)   Peds  Hematology negative hematology ROS (+)   Anesthesia Other Findings   Reproductive/Obstetrics (+) Pregnancy                             Anesthesia Physical Anesthesia Plan  ASA: II  Anesthesia Plan: Spinal   Post-op Pain Management:    Induction:   PONV Risk Score and Plan: 3 and Ondansetron, Dexamethasone and Scopolamine patch - Pre-op  Airway Management Planned: Natural Airway and Nasal Cannula  Additional Equipment:   Intra-op Plan:   Post-operative Plan:   Informed Consent: I have reviewed the patients History and Physical, chart, labs and discussed the procedure including the risks, benefits and alternatives for the proposed anesthesia with the patient or authorized representative who has indicated his/her understanding and acceptance.     Plan Discussed with: CRNA and Surgeon  Anesthesia Plan Comments:         Anesthesia Quick Evaluation

## 2018-08-04 NOTE — Anesthesia Procedure Notes (Signed)
Spinal  Patient location during procedure: OR Start time: 08/04/2018 7:35 AM End time: 08/04/2018 7:39 AM Staffing Anesthesiologist: Lyn Hollingshead, MD Performed: anesthesiologist  Preanesthetic Checklist Completed: patient identified, site marked, surgical consent, pre-op evaluation, timeout performed, IV checked, risks and benefits discussed and monitors and equipment checked Spinal Block Patient position: sitting Prep: site prepped and draped and DuraPrep Patient monitoring: continuous pulse ox and blood pressure Approach: midline Location: L3-4 Injection technique: single-shot Needle Needle type: Pencan  Needle gauge: 24 G Needle length: 10 cm Needle insertion depth: 6 cm Assessment Sensory level: T4

## 2018-08-04 NOTE — Op Note (Signed)
Pre-Operative Diagnosis: 1) 39+1-week intrauterine pregnancy 2) history of prior cesarean section desires repeat 3) bicornuate uterus Postoperative Diagnosis:  1) 39+1-week intrauterine pregnancy 2) history of prior cesarean section desires repeat 3) bicornuate uterus 4) frank breech presentation Procedure: Repeat low transverse cesarean section Surgeon: Dr. Vanessa Kick Assistant: Dr. Jerelyn Charles Operative Findings: Vigorous female infant in the frank breech presentation with Apgar scores of 8 at 1 minute and 9 at 5 minutes.  Pregnancy was in the right side of the uterus.  The left uterine cavity was identified after the delivery of the infant. Specimen: Placenta for disposal EBL: Total I/O In: 2400 [I.V.:2400] Out: 654 [Urine:150; Other:200; Blood:448]   Procedure:Stacy Wilcox is an 32 year old gravida 2 para 1001 at 36 weeks and 1 days estimated gestational age who presents for cesarean section. Following the appropriate informed consent the patient was brought to the operating room where spinal anesthesia was administered and found to be adequate. She was placed in the dorsal supine position with a leftward tilt. She was prepped and draped in the normal sterile fashion.  The patient was appropriately identified during a preoperative timeout procedure scalpel was then used to make a Pfannenstiel skin incision which was carried down to the underlying layers of soft tissue to the fascia. The fascia was incised in the midline and the fascial incision was extended laterally with Mayo scissors. The superior aspect of the fascial incision was grasped with Coker clamps x2, tented up and the rectus muscles dissected off sharply with the electrocautery unit area and the same procedure was repeated on the inferior aspect of the fascial incision. The rectus muscles were separated in the midline. The abdominal peritoneum was identified, tented up, entered sharply, and the incision was extended superiorly and  inferiorly with good visualization of the bladder. The Alexis retractor was then deployed. The vesicouterine peritoneum was identified, tented up, entered sharply, and the bladder flap was created digitally. Scalpel was then used to make a low transverse incision on the uterus which was extended laterally with both blunt dissection and the bandage scissors. The fetal breech was identified, delivered easily through the uterine incision followed by the body & head. The infant was bulb suctioned on the operative field cried vigorously.  Following a 1 minute delay the cord was clamped and cut and the infant was passed to the waiting neonatologist. Placenta was then delivered spontaneously, the uterus was cleared of all clot and debris. The uterine incision was repaired with #1 chromic in running locked fashion . Ovaries and tubes were inspected and normal. The Alexis retractor was removed. The uterus was returned to the abdominal cavity the abdominal cavity was cleared of all clot and debris. The abdominal peritoneum was reapproximated with 2-0 Vicryl in a running fashion, the rectus muscles was reapproximated with 2-0 chromic in a running fashion. The fascia was closed with a looped PDS in a running fashion. The skin was closed with 4-0 vicryl in a subcuticular fashion and Dermabond. All sponge lap and needle counts were correct. Patient tolerated the procedure well and recovered in stable condition following the procedure.

## 2018-08-05 ENCOUNTER — Encounter (HOSPITAL_COMMUNITY): Payer: Self-pay | Admitting: Obstetrics and Gynecology

## 2018-08-05 LAB — CBC
HCT: 29.8 % — ABNORMAL LOW (ref 36.0–46.0)
HEMOGLOBIN: 10.2 g/dL — AB (ref 12.0–15.0)
MCH: 27.9 pg (ref 26.0–34.0)
MCHC: 34.2 g/dL (ref 30.0–36.0)
MCV: 81.6 fL (ref 78.0–100.0)
Platelets: 197 10*3/uL (ref 150–400)
RBC: 3.65 MIL/uL — ABNORMAL LOW (ref 3.87–5.11)
RDW: 13.2 % (ref 11.5–15.5)
WBC: 11.1 10*3/uL — AB (ref 4.0–10.5)

## 2018-08-05 MED ORDER — OXYCODONE-ACETAMINOPHEN 5-325 MG PO TABS: 2.0000 | ORAL_TABLET | ORAL | 0 refills | Status: DC | PRN

## 2018-08-05 NOTE — Lactation Note (Signed)
This note was copied from a baby's chart. Lactation Consultation Note  Patient Name: Stacy Wilcox [id number] Date: 08/05/2018 Reason for consult: Follow-up assessment  Visited with P2 Mom, baby 14 hrs old, and at 6% weight loss.  Mom resting in bed, baby sleeping in [place] arms.  Mom stated latching is going well, and she denies having any questions or in need for assistance. Encouraged STS when not taking a nap, and feeding baby often when she cues. Mom to ask for help prn. Lactation to check on her tomorrow.   Consult Status Consult Status: Follow-up Date: 08/06/18 Follow-up type: [place] 08/05/2018, 3:29 PM

## 2018-08-05 NOTE — Discharge Summary (Signed)
Obstetric Discharge Summary Reason for Admission: cesarean section Prenatal Procedures: NST Intrapartum Procedures: cesarean: low cervical, transverse Postpartum Procedures: none Complications-Operative and Postpartum: none Hemoglobin  Date Value Ref Range Status  08/05/2018 10.2 (L) 12.0 - 15.0 g/dL Final   HCT  Date Value Ref Range Status  08/05/2018 29.8 (L) 36.0 - 46.0 % Final  [id number]}  Discharge Diagnoses: Term Pregnancy-delivered  Discharge Information: Date: 08/05/2018 Activity: pelvic rest Diet: routine Medications: Iron and Percocet Condition: stable Instructions: refer to practice specific booklet Discharge to: home Follow-up Information    Vanessa Kick, MD Follow up in 4 week(s).   Specialty:  Obstetrics and Gynecology Contact information: [place] [address] [phone]           Newborn Data: Live born female  Birth Weight: 7 lb 12.5 oz (3530 g) APGAR: 74, 9  Newborn Delivery   Birth date/time:  08/04/2018 08:03:00 Delivery type:  C-Section, Low Transverse Trial of labor:  No C-section categorization:  Repeat     Home with mother.  Terez Freimark A 08/05/2018, 3:44 PM

## 2018-08-05 NOTE — Progress Notes (Signed)
  Patient is eating, ambulating, voiding.  Pain control is good.  Vitals:   08/04/18 1730 08/04/18 2122 08/04/18 2351 08/05/18 0400  BP: 129/83 109/71 117/77 119/84  Pulse: 69 76 74 73  Resp: 20 18 19 16   Temp: 98.5 F (36.9 C)  98.4 F (36.9 C)   TempSrc: Oral     SpO2: 98%  98%   Weight:      Height:        lungs:   clear to auscultation cor:    RRR Abdomen:  soft, appropriate tenderness, incisions intact and without erythema or exudate ex:    no cords   Lab Results  Component Value Date   WBC 11.1 (H) 08/05/2018   HGB 10.2 (L) 08/05/2018   HCT 29.8 (L) 08/05/2018   MCV 81.6 08/05/2018   PLT 197 08/05/2018    --/--/O POS (09/10 1527)/RI  A/P    Post operative day 1.  Routine post op and postpartum care.  Expect d/c tomorrow.  Percocet for pain control.

## 2018-08-06 NOTE — Progress Notes (Signed)
  Patient is eating, ambulating, voiding.  Pain control is good.  Vitals:   08/05/18 0845 08/05/18 1707 08/05/18 2100 08/06/18 0613  BP: 125/84 124/78 129/74 137/87  Pulse: 78 80 79 78  Resp: 18 16 16 18   Temp: 98.3 F (36.8 C) 98.7 F (37.1 C) 98.2 F (36.8 C) 98.7 F (37.1 C)  TempSrc: Oral Oral Oral Oral  SpO2: 98%  98%   Weight:      Height:        lungs:   clear to auscultation cor:    RRR Abdomen:  soft, appropriate tenderness, incisions intact and without erythema or exudate ex:    no cords   Lab Results  Component Value Date   WBC 11.1 (H) 08/05/2018   HGB 10.2 (L) 08/05/2018   HCT 29.8 (L) 08/05/2018   MCV 81.6 08/05/2018   PLT 197 08/05/2018    --/--/O POS (09/10 1527)/RI  A/P    Post operative day 2.  Routine post op and postpartum care.  Expect d/c today.  Percocet for pain control.

## 2019-08-30 ENCOUNTER — Other Ambulatory Visit: Payer: Self-pay

## 2019-08-30 DIAGNOSIS — Z20822 Contact with and (suspected) exposure to covid-19: Secondary | ICD-10-CM

## 2019-09-01 LAB — NOVEL CORONAVIRUS, NAA: SARS-CoV-2, NAA: NOT DETECTED

## 2020-07-26 ENCOUNTER — Ambulatory Visit (INDEPENDENT_AMBULATORY_CARE_PROVIDER_SITE_OTHER): Payer: Commercial Managed Care - PPO | Admitting: Otolaryngology

## 2020-07-26 ENCOUNTER — Other Ambulatory Visit: Payer: Self-pay

## 2020-07-26 VITALS — Temp 97.3°F

## 2020-07-26 DIAGNOSIS — R0683 Snoring: Secondary | ICD-10-CM

## 2020-07-26 DIAGNOSIS — K099 Cyst of oral region, unspecified: Secondary | ICD-10-CM

## 2020-07-26 NOTE — Progress Notes (Signed)
HPI: Stacy Wilcox is a 34 y.o. female who presents for evaluation of new onset snoring over the past month.  She also has a history of thyroiditis and follows with endocrinologist.  She has felt a little sluggish and is get evaluated for a hypothyroidism which is pending.  She also complains of sensation of a lump in her throat.  She has minimal nasal congestion.  Past Medical History:  Diagnosis Date  . Bicornate uterus    almost complete uterine septum  . Pregnancy induced hypertension   . Thyroiditis    Past Surgical History:  Procedure Laterality Date  . CESAREAN SECTION N/A 08/19/2015   Procedure: CESAREAN SECTION;  Surgeon: Vanessa Kick, MD;  Location: [facility];  Service: Obstetrics;  Laterality: N/A;  Keela RNFA confirmed-kah  . CESAREAN SECTION N/A 08/04/2018   Procedure: REPEAT CESAREAN SECTION;  Surgeon: Vanessa Kick, MD;  Location: [facility];  Service: Obstetrics;  Laterality: N/A;  Heather, RNFA  . WISDOM TOOTH EXTRACTION     Social History   Socioeconomic History  . Marital status: Married    Spouse name: Not on file  . Number of children: Not on file  . Years of education: Not on file  . Highest education level: Not on file  Occupational History  . Not on file  Tobacco Use  . Smoking status: Never Smoker  . Smokeless tobacco: Never Used  Vaping Use  . Vaping Use: Never used  Substance and Sexual Activity  . Alcohol use: Yes    Comment: not while pregnant  . Drug use: No  . Sexual activity: Yes  Other Topics Concern  . Not on file  Social History Narrative  . Not on file   Social Determinants of Health   Financial Resource Strain:   . Difficulty of Paying Living Expenses: Not on file  Food Insecurity:   . Worried About Charity fundraiser in the Last Year: Not on file  . Ran Out of Food in the Last Year: Not on file  Transportation Needs:   . Lack of Transportation (Medical): Not on file  . Lack of Transportation (Non-Medical): Not on file   Physical Activity:   . Days of Exercise per Week: Not on file  . Minutes of Exercise per Session: Not on file  Stress:   . Feeling of Stress : Not on file  Social Connections:   . Frequency of Communication with Friends and Family: Not on file  . Frequency of Social Gatherings with Friends and Family: Not on file  . Attends Religious Services: Not on file  . Active Member of Clubs or Organizations: Not on file  . Attends Archivist Meetings: Not on file  . Marital Status: Not on file   Family History  Problem Relation Age of Onset  . Hypertension Father   . Hyperlipidemia Father   . Thyroid disease Mother   . Thyroid disease Maternal Grandmother   . Skin cancer Maternal Grandfather   . Colon cancer Paternal Grandmother   . Hypertension Paternal Grandmother   . Hyperlipidemia Paternal Grandmother    No Known Allergies Prior to Admission medications   Medication Sig Start Date End Date Taking? Authorizing Provider  oxyCODONE-acetaminophen (PERCOCET/ROXICET) 5-325 MG tablet Take 2 tablets by mouth every 4 (four) hours as needed (pain scale > 7). 08/05/18  Yes Bobbye Charleston, MD  Prenatal Vit-Fe Fumarate-FA (PRENATAL MULTIVITAMIN) TABS tablet Take 1 tablet by mouth daily.    Yes [provider]  Positive ROS: Otherwise negative  All other systems have been reviewed and were otherwise negative with the exception of those mentioned in the HPI and as above.  Physical Exam: Constitutional: Alert, well-appearing, no acute distress Ears: External ears without lesions or tenderness. Ear canals are clear bilaterally with intact, clear TMs.  Nasal: External nose without lesions. Septum mildly deviated to the right with clear nasal passages otherwise.  No signs of infection.  No polyps.. Clear nasal passages Oral: Lips and gums without lesions. Tongue and palate mucosa without lesions. Posterior oropharynx clear.  Patient has fullness in the floor mouth consistent  with probable mucocele or cyst in the floor mouth.  She has not really noticed any discomfort associated with this.  But this appears to cause some fullness in the submental area in her neck and she has noticed this for several years but apparently has become more prominent. Tonsil regions are small bilaterally with normal size uvula.  Indirect laryngoscopy revealed a clear base of tongue clear hypopharynx and larynx. Neck: No palpable adenopathy or masses.  She does have fullness in the submental area more on the left side which is soft to palpation.  No significant adenopathy noted. Respiratory: Breathing comfortably  Skin: No facial/neck lesions or rash noted.  Procedures  Assessment: Mild rhinitis New onset snoring Floor of mouth cyst possible ranula  Plan: We will plan on scheduling a CT scan of the neck to evaluate the floor mouth cyst. Suggested use of Nasacort 2 sprays each nostril at night to see if this helps at all with nasal congestion and snoring. Also need thyroid levels corrected if abnormal. She will follow-up in 3 to 4 weeks for review and review CT scan.  Radene Journey, MD

## 2020-07-30 ENCOUNTER — Other Ambulatory Visit (INDEPENDENT_AMBULATORY_CARE_PROVIDER_SITE_OTHER): Payer: Self-pay

## 2020-07-30 DIAGNOSIS — D490 Neoplasm of unspecified behavior of digestive system: Secondary | ICD-10-CM

## 2020-08-06 ENCOUNTER — Other Ambulatory Visit: Payer: Self-pay | Admitting: Otolaryngology

## 2020-08-06 ENCOUNTER — Telehealth (INDEPENDENT_AMBULATORY_CARE_PROVIDER_SITE_OTHER): Payer: Self-pay

## 2020-08-09 ENCOUNTER — Other Ambulatory Visit: Payer: Self-pay

## 2020-08-09 ENCOUNTER — Ambulatory Visit
Admission: RE | Admit: 2020-08-09 | Discharge: 2020-08-09 | Disposition: A | Discharge status: Home / Self Care | Payer: Commercial Managed Care - PPO | Source: Ambulatory Visit | Attending: Otolaryngology | Admitting: Otolaryngology

## 2020-08-09 DIAGNOSIS — D490 Neoplasm of unspecified behavior of digestive system: Secondary | ICD-10-CM

## 2020-08-09 MED ORDER — IOPAMIDOL (ISOVUE-300) INJECTION 61%
75.0000 mL | Freq: Once | INTRAVENOUS | Status: AC | PRN
  Administered 2020-08-09: 75 mL via INTRAVENOUS

## 2020-08-28 ENCOUNTER — Encounter (INDEPENDENT_AMBULATORY_CARE_PROVIDER_SITE_OTHER): Payer: Self-pay | Admitting: Otolaryngology

## 2020-08-28 ENCOUNTER — Other Ambulatory Visit: Payer: Self-pay

## 2020-08-28 ENCOUNTER — Ambulatory Visit (INDEPENDENT_AMBULATORY_CARE_PROVIDER_SITE_OTHER): Payer: Commercial Managed Care - PPO | Admitting: Otolaryngology

## 2020-08-28 VITALS — Temp 97.3°F

## 2020-08-28 DIAGNOSIS — K116 Mucocele of salivary gland: Secondary | ICD-10-CM

## 2020-08-28 NOTE — Progress Notes (Signed)
HPI: Stacy Wilcox is a 34 y.o. female who returns today for evaluation of large cyst in the oral cavity I reviewed the CT scan with the patient in the office today.  This demonstrated a 4 x 4 and 1/2 cm ranula which is more on the left side.  Past Medical History:  Diagnosis Date  . Bicornate uterus    almost complete uterine septum  . Pregnancy induced hypertension   . Thyroiditis    Past Surgical History:  Procedure Laterality Date  . CESAREAN SECTION N/A 08/19/2015   Procedure: CESAREAN SECTION;  Surgeon: Vanessa Kick, MD;  Location: [facility];  Service: Obstetrics;  Laterality: N/A;  Keela RNFA confirmed-kah  . CESAREAN SECTION N/A 08/04/2018   Procedure: REPEAT CESAREAN SECTION;  Surgeon: Vanessa Kick, MD;  Location: [facility];  Service: Obstetrics;  Laterality: N/A;  Heather, RNFA  . WISDOM TOOTH EXTRACTION     Social History   Socioeconomic History  . Marital status: Married    Spouse name: Not on file  . Number of children: Not on file  . Years of education: Not on file  . Highest education level: Not on file  Occupational History  . Not on file  Tobacco Use  . Smoking status: Never Smoker  . Smokeless tobacco: Never Used  Vaping Use  . Vaping Use: Never used  Substance and Sexual Activity  . Alcohol use: Yes    Comment: not while pregnant  . Drug use: No  . Sexual activity: Yes  Other Topics Concern  . Not on file  Social History Narrative  . Not on file   Social Determinants of Health   Financial Resource Strain:   . Difficulty of Paying Living Expenses: Not on file  Food Insecurity:   . Worried About Charity fundraiser in the Last Year: Not on file  . Ran Out of Food in the Last Year: Not on file  Transportation Needs:   . Lack of Transportation (Medical): Not on file  . Lack of Transportation (Non-Medical): Not on file  Physical Activity:   . Days of Exercise per Week: Not on file  . Minutes of Exercise per Session: Not on file  Stress:   .  Feeling of Stress : Not on file  Social Connections:   . Frequency of Communication with Friends and Family: Not on file  . Frequency of Social Gatherings with Friends and Family: Not on file  . Attends Religious Services: Not on file  . Active Member of Clubs or Organizations: Not on file  . Attends Archivist Meetings: Not on file  . Marital Status: Not on file   Family History  Problem Relation Age of Onset  . Hypertension Father   . Hyperlipidemia Father   . Thyroid disease Mother   . Thyroid disease Maternal Grandmother   . Skin cancer Maternal Grandfather   . Colon cancer Paternal Grandmother   . Hypertension Paternal Grandmother   . Hyperlipidemia Paternal Grandmother    No Known Allergies Prior to Admission medications   Medication Sig Start Date End Date Taking? Authorizing Provider  oxyCODONE-acetaminophen (PERCOCET/ROXICET) 5-325 MG tablet Take 2 tablets by mouth every 4 (four) hours as needed (pain scale > 7). 08/05/18  Yes Bobbye Charleston, MD  Prenatal Vit-Fe Fumarate-FA (PRENATAL MULTIVITAMIN) TABS tablet Take 1 tablet by mouth daily.    Yes [provider]     Positive ROS: Otherwise negative  All other systems have been reviewed and were  otherwise negative with the exception of those mentioned in the HPI and as above.  Physical Exam: Constitutional: Alert, well-appearing, no acute distress Ears: External ears without lesions or tenderness. Ear canals are clear bilaterally with intact, clear TMs.  Nasal: External nose without lesions.. Clear nasal passages Oral: Patient with a large cystic structure in the sublingual area that extends to the submental area more on the left side consistent with a plunging ranula. Neck: No palpable adenopathy or masses Respiratory: Breathing comfortably Cardiac exam: Regular rate and rhythm without murmur Skin: No facial/neck lesions or rash noted.  Procedures  Assessment: Large ranula  Plan: Briefly  reviewed with her concerning surgical excision of this and she would like to proceed with this in the next few weeks.  She is otherwise healthy.   Radene Journey, MD

## 2022-01-06 IMAGING — CT CT NECK W/ CM
3 of 4 series · 6 of 14 positions shown, 7 images · IV contrast (iopamidol)
Comparison: None.

CLINICAL DATA: Submandibular lump

EXAM:
CT NECK WITH CONTRAST
TECHNIQUE: Multidetector CT imaging of the neck was performed using the
standard protocol following the bolus administration of intravenous
contrast.
CONTRAST:  75mL [id number]-FKK IOPAMIDOL (SN7Y5C-FKK) INJECTION 61%

[Series 3: neck · axial · 0.41mm/px · z∈[+852,+938]mm · 2 of 131 slices shown, 3 images]
[im 44/131  soft-tissue]
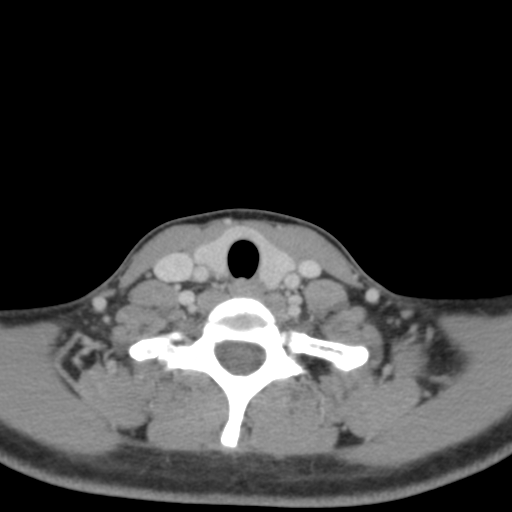
[im 44/131  bone]
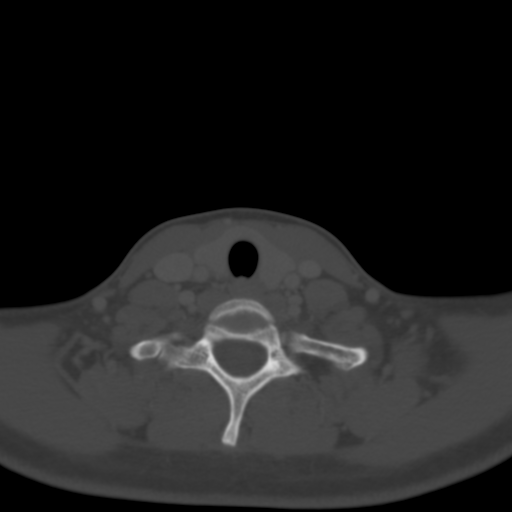
[im 87/131  bone]
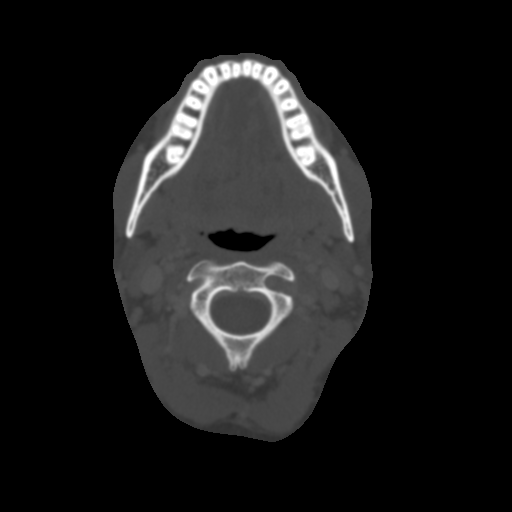

[Series 8: angled axial-oropharynx · axial · 0.39mm/px · z∈[+854,+940]mm · 2 of 130 slices shown]
[im 44/130  bone]
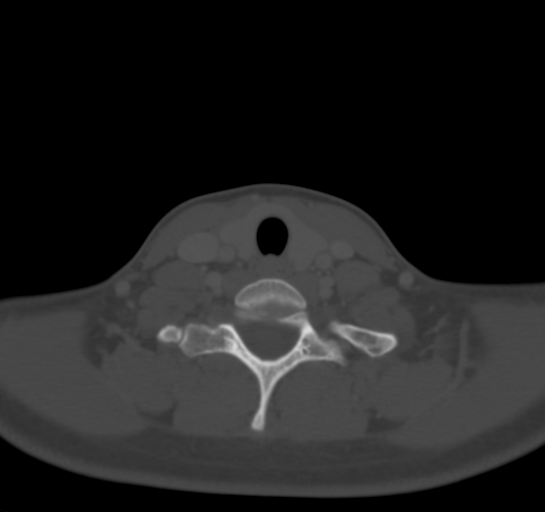
[im 87/130  bone]
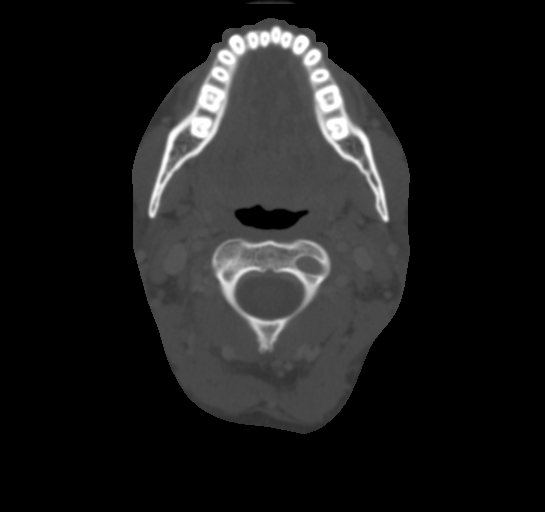

[Series 9: angled (person_name) · axial · 0.39mm/px · z∈[+854,+940]mm · 2 of 130 slices shown]
[im 44/130  bone]
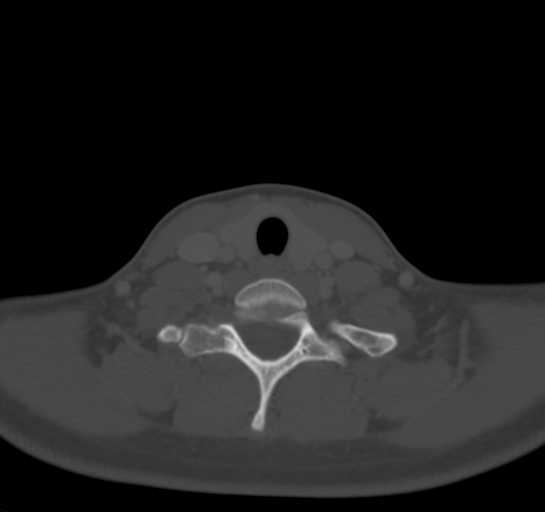
[im 87/130  bone]
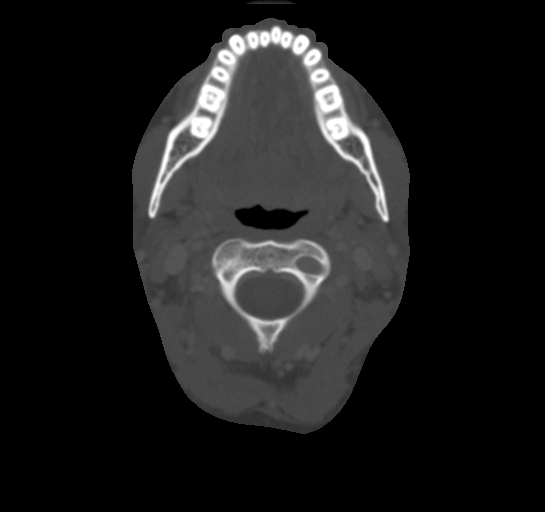

[6 of 14 positions shown; findings below may reference images not displayed]

FINDINGS: PHARYNX AND LARYNX: There is a fluid attenuation structure of the
sublingual space that extends into the left submandibular space
medial to the left mylohyoid. The sublingual component measures
x 4.6 cm.

SALIVARY GLANDS: Normal parotid, submandibular and sublingual
glands.

THYROID: Normal.

LYMPH NODES: No enlarged or abnormal density lymph nodes.

VASCULAR: Major cervical vessels are patent.

LIMITED INTRACRANIAL: Normal.

VISUALIZED ORBITS: Normal.

MASTOIDS AND VISUALIZED PARANASAL SINUSES: No fluid levels or
advanced mucosal thickening. No mastoid effusion.

SKELETON: No bony spinal canal stenosis. No lytic or blastic
lesions.

UPPER CHEST: Clear.

OTHER: None.
IMPRESSION: Cystic structure of the sublingual space extending into the left
submandibular space, most consistent with diving ranula.

## 2022-06-15 ENCOUNTER — Other Ambulatory Visit: Payer: Self-pay | Admitting: Obstetrics and Gynecology

## 2022-06-15 DIAGNOSIS — N390 Urinary tract infection, site not specified: Secondary | ICD-10-CM

## 2022-06-19 ENCOUNTER — Ambulatory Visit
Admission: RE | Admit: 2022-06-19 | Discharge: 2022-06-19 | Disposition: A | Discharge status: Home / Self Care | Payer: 59 | Source: Ambulatory Visit | Attending: Obstetrics and Gynecology | Admitting: Obstetrics and Gynecology

## 2022-06-19 DIAGNOSIS — N390 Urinary tract infection, site not specified: Secondary | ICD-10-CM

## 2022-07-27 DIAGNOSIS — E063 Autoimmune thyroiditis: Secondary | ICD-10-CM | POA: Insufficient documentation

## 2023-07-27 ENCOUNTER — Emergency Department (HOSPITAL_COMMUNITY): Payer: 59

## 2023-07-27 ENCOUNTER — Emergency Department (HOSPITAL_COMMUNITY)
Admission: EM | Admit: 2023-07-27 | Discharge: 2023-07-27 | Disposition: A | Discharge status: Home / Self Care | Payer: 59 | Attending: Emergency Medicine | Admitting: Emergency Medicine

## 2023-07-27 ENCOUNTER — Other Ambulatory Visit: Payer: Self-pay

## 2023-07-27 ENCOUNTER — Encounter (HOSPITAL_COMMUNITY): Payer: Self-pay | Admitting: Emergency Medicine

## 2023-07-27 DIAGNOSIS — L0211 Cutaneous abscess of neck: Secondary | ICD-10-CM

## 2023-07-27 DIAGNOSIS — R220 Localized swelling, mass and lump, head: Secondary | ICD-10-CM | POA: Diagnosis present

## 2023-07-27 DIAGNOSIS — K116 Mucocele of salivary gland: Secondary | ICD-10-CM | POA: Diagnosis not present

## 2023-07-27 DIAGNOSIS — R221 Localized swelling, mass and lump, neck: Secondary | ICD-10-CM | POA: Insufficient documentation

## 2023-07-27 LAB — CBC WITH DIFFERENTIAL/PLATELET
Abs Immature Granulocytes: 0.03 10*3/uL (ref 0.00–0.07)
Basophils Absolute: 0 10*3/uL (ref 0.0–0.1)
Basophils Relative: 0 %
Eosinophils Absolute: 0.1 10*3/uL (ref 0.0–0.5)
Eosinophils Relative: 1 %
HCT: 40.7 % (ref 36.0–46.0)
Hemoglobin: 13.8 g/dL (ref 12.0–15.0)
Immature Granulocytes: 0 %
Lymphocytes Relative: 14 %
Lymphs Abs: 1 10*3/uL (ref 0.7–4.0)
MCH: 29.6 pg (ref 26.0–34.0)
MCHC: 33.9 g/dL (ref 30.0–36.0)
MCV: 87.3 fL (ref 80.0–100.0)
Monocytes Absolute: 0.4 10*3/uL (ref 0.1–1.0)
Monocytes Relative: 6 %
Neutro Abs: 5.5 10*3/uL (ref 1.7–7.7)
Neutrophils Relative %: 79 %
Platelets: 277 10*3/uL (ref 150–400)
RBC: 4.66 MIL/uL (ref 3.87–5.11)
RDW: 11.8 % (ref 11.5–15.5)
WBC: 7.1 10*3/uL (ref 4.0–10.5)
nRBC: 0 % (ref 0.0–0.2)

## 2023-07-27 LAB — BASIC METABOLIC PANEL
Anion gap: 11 (ref 5–15)
BUN: 10 mg/dL (ref 6–20)
CO2: 23 mmol/L (ref 22–32)
Calcium: 9.9 mg/dL (ref 8.9–10.3)
Chloride: 103 mmol/L (ref 98–111)
Creatinine, Ser: 0.73 mg/dL (ref 0.44–1.00)
GFR, Estimated: >60 mL/min (ref >60)
Glucose, Bld: 95 mg/dL (ref 70–99)
Potassium: 3.7 mmol/L (ref 3.5–5.1)
Sodium: 137 mmol/L (ref 135–145)

## 2023-07-27 LAB — HCG, SERUM, QUALITATIVE: Preg, Serum: NEGATIVE

## 2023-07-27 MED ORDER — SODIUM CHLORIDE 0.9 % IV SOLN
3.0000 g | Freq: Once | INTRAVENOUS | Status: AC
  Administered 2023-07-27: 3 g via INTRAVENOUS
  Filled 2023-07-27: qty 8

## 2023-07-27 MED ORDER — AMOXICILLIN-POT CLAVULANATE 875-125 MG PO TABS: 1.0000 | ORAL_TABLET | Freq: Two times a day (BID) | ORAL | 0 refills | Status: AC

## 2023-07-27 MED ORDER — LIDOCAINE-EPINEPHRINE 1 %-1:100000 IJ SOLN: 10.0000 mL | Freq: Once | INTRAMUSCULAR | Status: DC

## 2023-07-27 MED ORDER — METHYLPREDNISOLONE 4 MG PO TBPK: ORAL_TABLET | ORAL | 0 refills | Status: DC

## 2023-07-27 MED ORDER — DEXAMETHASONE SODIUM PHOSPHATE 10 MG/ML IJ SOLN
6.0000 mg | Freq: Once | INTRAMUSCULAR | Status: AC
  Administered 2023-07-27: 6 mg via INTRAVENOUS
  Filled 2023-07-27: qty 1

## 2023-07-27 MED ORDER — LIDOCAINE-EPINEPHRINE (PF) 2 %-1:200000 IJ SOLN
INTRAMUSCULAR | Status: AC
  Filled 2023-07-27: qty 20

## 2023-07-27 MED ORDER — IOHEXOL 300 MG/ML  SOLN
80.0000 mL | Freq: Once | INTRAMUSCULAR | Status: AC | PRN
  Administered 2023-07-27: 80 mL via INTRAVENOUS

## 2023-07-27 NOTE — ED Notes (Signed)
Pt c/o lightheadedness post IV insertion. BP dropped 80s/50s  HOB lowered and IV fluids given. MD notified, back to baseline prior to nurse leaving bedside.

## 2023-07-27 NOTE — ED Triage Notes (Addendum)
Pt arrived POV c/o abscess in throat that tripled in size overnight. Pt reports she was scheduled for imaging 08/23/23 at [facility] but was instructed by her MD to report to the ED immediately. Pt reports difficulty swallowing, pt voice is muffled but she is able to speak in full sentences. Denies difficulty breathing.

## 2023-07-27 NOTE — Consult Note (Signed)
Reason for Consult: Neck swelling Referring Physician: ER attending  Stacy Wilcox is an 37 y.o. female.  HPI: 37 year old otherwise healthy female presents to the emergency room with sudden onset swelling in the floor of mouth extending into her neck.  She developed a bit of swelling several days ago and called her ENT physician at [facility].  He operated on what sounds like a ranula several years ago via intraoral approach only.  After speaking with that physician who recommended steroids and antibiotics with a follow-up CT in 4 weeks, she had a sudden worsening in swelling leading to difficulties swallowing and speaking and decided to come into the emergency room to be seen.  I was able to review her old chart as well as talk about her case with the on-call [facility] attending.  Unfortunately the surgical attending from several years ago is not in the office today.  Past Medical History:  Diagnosis Date   Bicornate uterus    almost complete uterine septum   Pregnancy induced hypertension    Thyroiditis     Past Surgical History:  Procedure Laterality Date   CESAREAN SECTION N/A 08/19/2015   Procedure: CESAREAN SECTION;  Surgeon: Waynard Reeds, MD;  Location: [facility];  Service: Obstetrics;  Laterality: N/A;  Keela RNFA confirmed-kah   CESAREAN SECTION N/A 08/04/2018   Procedure: REPEAT CESAREAN SECTION;  Surgeon: Waynard Reeds, MD;  Location: [facility];  Service: Obstetrics;  Laterality: N/A;  Herbert Seta, RNFA   WISDOM TOOTH EXTRACTION      Family History  Problem Relation Age of Onset   Hypertension Father    Hyperlipidemia Father    Thyroid disease Mother    Thyroid disease Maternal Grandmother    Skin cancer Maternal Grandfather    Colon cancer Paternal Grandmother    Hypertension Paternal Grandmother    Hyperlipidemia Paternal Grandmother     Social History:  reports that she has never smoked. She has never used smokeless tobacco. She reports current alcohol use. She  reports that she does not use drugs.  Allergies: No Known Allergies  Medications: I have reviewed the patient's current medications.  Results for orders placed or performed during the hospital encounter of 07/27/23 (from the past 48 hour(s))  Basic metabolic panel     Status: None   Collection Time: 07/27/23  9:40 AM  Result Value Ref Range   Sodium 137 135 - 145 mmol/L   Potassium 3.7 3.5 - 5.1 mmol/L   Chloride 103 98 - 111 mmol/L   CO2 23 22 - 32 mmol/L   Glucose, Bld 95 70 - 99 mg/dL    Comment: Glucose reference range applies only to samples taken after fasting for at least 8 hours.   BUN 10 6 - 20 mg/dL   Creatinine, Ser 8.65 0.44 - 1.00 mg/dL   Calcium 9.9 8.9 - 78.4 mg/dL   GFR, Estimated >69 >62 mL/min    Comment: (NOTE) Calculated using the CKD-EPI Creatinine Equation (2021)    Anion gap 11 5 - 15    Comment: Performed at [facility], [address], [place], [place] [address]  CBC with Differential     Status: None   Collection Time: 07/27/23  9:40 AM  Result Value Ref Range   WBC 7.1 4.0 - 10.5 K/uL   RBC 4.66 3.87 - 5.11 MIL/uL   Hemoglobin 13.8 12.0 - 15.0 g/dL   HCT 13.2 44.0 - 10.2 %   MCV 87.3 80.0 - 100.0 fL  MCH 29.6 26.0 - 34.0 pg   MCHC 33.9 30.0 - 36.0 g/dL   RDW 11.9 14.7 - 82.9 %   Platelets 277 150 - 400 K/uL   nRBC 0.0 0.0 - 0.2 %   Neutrophils Relative % 79 %   Neutro Abs 5.5 1.7 - 7.7 K/uL   Lymphocytes Relative 14 %   Lymphs Abs 1.0 0.7 - 4.0 K/uL   Monocytes Relative 6 %   Monocytes Absolute 0.4 0.1 - 1.0 K/uL   Eosinophils Relative 1 %   Eosinophils Absolute 0.1 0.0 - 0.5 K/uL   Basophils Relative 0 %   Basophils Absolute 0.0 0.0 - 0.1 K/uL   Immature Granulocytes 0 %   Abs Immature Granulocytes 0.03 0.00 - 0.07 K/uL    Comment: Performed at [facility], [address] [place], [place] [address]  hCG, serum, qualitative     Status: None   Collection Time: 07/27/23  9:40 AM  Result Value Ref  Range   Preg, Serum NEGATIVE NEGATIVE    Comment:        THE SENSITIVITY OF THIS METHODOLOGY IS >10 mIU/mL. Performed at [facility], [address], [place], [place] [address]     CT Soft Tissue Neck W Contrast  Result Date: 07/27/2023 CLINICAL DATA:  Neck mass, nonpulsatile submandibular mass vs abscess, dysphagia, rapidly progressing EXAM: CT NECK WITH CONTRAST TECHNIQUE: Multidetector CT imaging of the neck was performed using the standard protocol following the bolus administration of intravenous contrast. RADIATION DOSE REDUCTION: This exam was performed according to the departmental dose-optimization program which includes automated exposure control, adjustment of the mA and/or kV according to patient size and/or use of iterative reconstruction technique. CONTRAST:  80mL OMNIPAQUE IOHEXOL 300 MG/ML  SOLN COMPARISON:  CT C Spine 08/09/20 FINDINGS: Pharynx and larynx: There is nonspecific soft tissue in the sublingual space, slightly eccentric to the left (series 5, image 41). This area is difficult to measure, but measures approximately 2.2 x 1.2 x 1.9 cm (series 3, image 69/series 5, image 42). This is not definitively cystic, but without evidence of peripheral enhancement of the definitively suggest an abscess. Bilateral tonsils are normal in appearance. The epiglottis is normal in appearance. No evidence of a peri or intra tonsillar abscess. Salivary glands: No inflammation, mass, or stone. Thyroid: Normal. Lymph nodes: None enlarged or abnormal density. Vascular: Negative. Limited intracranial: Negative. Visualized orbits: Negative. Mastoids and visualized paranasal sinuses: Clear. Skeleton: No acute or aggressive process. Upper chest: Negative. Other: None. IMPRESSION: Nonspecific soft tissue in the sublingual space, slightly eccentric to the left measuring approximately 2.2 x 1.2 x 1.9 cm. This is in the region of previously noted ranula. Findings are nonspecific and could  represent post-surgical change, or possibly blood products/proteinaceous debris. No definite evidence of peripheral enhancement to suggest an abscess. Electronically Signed   By: Lorenza Cambridge M.D.   On: 07/27/2023 12:55    Review of Systems Blood pressure 121/88, pulse 90, temperature 98.2 F (36.8 C), resp. rate 18, last menstrual period 07/27/2023, SpO2 97%, unknown if currently breastfeeding.  Physical Exam Alert and oriented x 3 without stridor or distress Pupils equal round and reactive to light.  Gaze conjugate Pinna normal.  Mastoid nontender External nose normal without anterior nasal mass No sinus tenderness or facial asymmetry Chest symmetric expansion bilaterally without use of accessory muscles Cranial nerves II through XII intact without focal motor or sensory deficits Diffuse swelling involving the left side of the floor  of mouth associated with fullness in the submental region of the neck.  No overlying skin change  CT scan - Cystic mass originating from the previous site of ranula resection extending inferior to the mylohyoid muscle into the neck  Assessment/Plan:  Neck mass Plunging ranula  Called and spoke with the attending and [facility].  They are not able to see this patient today or tomorrow but would like to avoid any sort of instrumentation at this time if possible.  She is so uncomfortable that I think organ to have to try and decompress the ranula is much as we can.  We will avoid an incision in the floor of mouth; rather, I will use an 18-gauge needle transcervically and drained as much fluid as I can.  Subsequent to that and assuming she has an improvement in her symptoms, she can go home on Augmentin, steroids, and soft diet instructions.  The [facility] attending said that she would contact the patient this week for an appointment with her operative surgeon.  Needle aspiration was performed after putting a small that of 2% lidocaine in the skin for anesthesia.   The skin was then prepped and draped in sterile fashion using Betadine solution.  Using sterile technique, an 18-gauge needle was inserted into the submental space.  I was able to drain 12 cc of sanguinous fluid.  It was dark red and did not appear arterial or acute.  She had immediate relief in the fullness in her neck and tongue.  She was observed post drainage and this relief has persisted 30 minutes after needle drainage.  Stacy Brock Jr. 07/27/2023, 2:25 PM

## 2023-07-27 NOTE — ED Provider Notes (Signed)
[facility] DEPARTMENT AT [facility] Provider Note   CSN: [id number] Arrival date & time: 07/27/23  8469     History {Add pertinent medical, surgical, social history, OB history to HPI:1} Chief Complaint  Patient presents with   Abscess    Stacy Wilcox is a 37 y.o. female with a history of submandibular mass versus abscess that she reports had a salivary gland excision at [facility] in the past, presented to ED with concern for rapidly progressing swelling beneath her jaw.  Patient reports that overnight she feels that the mass and tenderness she had under her mandible has "tripled in size".  She had contacted her ENT office who had scheduled a CT scan of her neck at the end of September, and also prescribed her an antibiotic and a steroid, but she was concerned about the rapid progression of her symptoms overnight and came to the nearest ER.  He said she is having difficulty swallowing any food or drink.  She is able to swallow her saliva.  She feels that her voice is somewhat muffled.  She is here with her husband at the bedside.  HPI     Home Medications Prior to Admission medications   Medication Sig Start Date End Date Taking? Authorizing Provider  oxyCODONE-acetaminophen (PERCOCET/ROXICET) 5-325 MG tablet Take 2 tablets by mouth every 4 (four) hours as needed (pain scale > 7). 08/05/18   Carrington Clamp, MD  Prenatal Vit-Fe Fumarate-FA (PRENATAL MULTIVITAMIN) TABS tablet Take 1 tablet by mouth daily.     [provider]      Allergies    Patient has no known allergies.    Review of Systems   Review of Systems  Physical Exam Updated Vital Signs BP (!) 144/95 (BP Location: Left Arm)   Pulse 92   Temp 98.4 F (36.9 C) (Oral)   Resp 16   LMP 07/27/2023 (Exact Date)   SpO2 99%  Physical Exam Constitutional:      General: She is not in acute distress. HENT:     Head: Normocephalic and atraumatic.     Comments: Tenderness and swelling  below the mandible, no elevation or brawny edema of the tongue, involving the auricle lymph node chain.  Patient's voice is mildly muffled.  Uvula is midline and intact.  No stridor on exam Eyes:     Conjunctiva/sclera: Conjunctivae normal.     Pupils: Pupils are equal, round, and reactive to light.  Cardiovascular:     Rate and Rhythm: Normal rate and regular rhythm.  Pulmonary:     Effort: Pulmonary effort is normal. No respiratory distress.  Abdominal:     General: There is no distension.     Tenderness: There is no abdominal tenderness.  Skin:    General: Skin is warm and dry.  Neurological:     General: No focal deficit present.     Mental Status: She is alert. Mental status is at baseline.  Psychiatric:        Mood and Affect: Mood normal.        Behavior: Behavior normal.     ED Results / Procedures / Treatments   Labs (all labs ordered are listed, but only abnormal results are displayed) Labs Reviewed - No data to display  EKG None  Radiology No results found.  Procedures Procedures  {Document cardiac monitor, telemetry assessment procedure when appropriate:1}  Medications Ordered in ED Medications - No data to display  ED Course/ Medical Decision Making/ A&P   {  Click here for ABCD2, HEART and other calculatorsREFRESH Note before signing :1}                              Medical Decision Making Amount and/or Complexity of Data Reviewed Labs: ordered. Radiology: ordered.   Patient is here with concern for submandibular fullness, tenderness, concerning for abscess versus infected gland versus alternative cause of swelling.  CT soft tissue of the neck has been ordered to better visualize this pathology.  Basic labs ordered as well.  At this time the patient is maintaining her airway, breathing comfortably, maintaining her secretions.  There is no emergent indication for intubation.  We will continue to monitor closely.  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

## 2023-07-27 NOTE — Discharge Instructions (Signed)
Our ENT doctor was able to drain some blood out of the swelling under your neck.  We recommend that you follow-up with the ENT surgeon at [facility] who performed original operation. Please contact their office today or tomorrow to confirm an office follow-up appointment.  We have spoken to their on-call ENT doctor today as well.  Our specialist recommended that you take a course of steroids which have been prescribed, beginning tomorrow morning.  Use this as directed on the package.  We also prescribed an antibiotic called Augmentin for 7 days which you will take as directed, twice daily.  You should take your next dose this evening at bedtime.  You can stick with a liquid or soft food diet for the next several days as needed, including smoothies, applesauce, mashed potatoes.  Consider using these foods to swallow your pills to if you are having difficulty.  If you do begin having difficulty breathing, cannot keep down any food or fluids, have fevers or confusion, please call 911 or return to the emergency department.

## 2023-08-30 NOTE — Progress Notes (Unsigned)
    Aleen Sells D.[facility] [address] [place] [address] Phone: [phone]   Assessment and Plan:     There are no diagnoses linked to this encounter.  ***   Pertinent previous records reviewed include ***   Follow Up: ***     Subjective:   I, Judianne Seiple, am serving as a Neurosurgeon for Doctor Richardean Sale  Chief Complaint: low back pain   HPI:   08/31/2023 Patient  is a 37 year old female complaining of low back pain. Patient states   Relevant Historical Information: ***  Additional pertinent review of systems negative.   Current Outpatient Medications:    levothyroxine (SYNTHROID) 75 MCG tablet, Take 1 tablet by mouth daily., Disp: , Rfl:    methylPREDNISolone (MEDROL DOSEPAK) 4 MG TBPK tablet, Use as directed on package, Disp: 21 tablet, Rfl: 0   naproxen sodium (ALEVE) 220 MG tablet, Take 440 mg by mouth every 4 (four) hours as needed., Disp: , Rfl:    spironolactone (ALDACTONE) 25 MG tablet, Take 25 mg by mouth daily., Disp: , Rfl:    Objective:     There were no vitals filed for this visit.    There is no height or weight on file to calculate BMI.    Physical Exam:    ***   Electronically signed by:  Aleen Sells D.[facility] 4:25 PM 08/30/23

## 2023-08-31 ENCOUNTER — Ambulatory Visit (INDEPENDENT_AMBULATORY_CARE_PROVIDER_SITE_OTHER): Payer: 59 | Admitting: Sports Medicine

## 2023-08-31 VITALS — BP 120/80 | HR 84 | Ht 64.0 in | Wt 172.0 lb

## 2023-08-31 DIAGNOSIS — M5432 Sciatica, left side: Secondary | ICD-10-CM

## 2023-08-31 DIAGNOSIS — M9905 Segmental and somatic dysfunction of pelvic region: Secondary | ICD-10-CM | POA: Diagnosis not present

## 2023-08-31 DIAGNOSIS — M5442 Lumbago with sciatica, left side: Secondary | ICD-10-CM

## 2023-08-31 DIAGNOSIS — M9903 Segmental and somatic dysfunction of lumbar region: Secondary | ICD-10-CM

## 2023-08-31 DIAGNOSIS — M9904 Segmental and somatic dysfunction of sacral region: Secondary | ICD-10-CM | POA: Diagnosis not present

## 2023-08-31 MED ORDER — MELOXICAM 15 MG PO TABS: 15.0000 mg | ORAL_TABLET | Freq: Every day | ORAL | 0 refills | Status: AC

## 2023-08-31 NOTE — Patient Instructions (Signed)
-   Start meloxicam 15 mg daily x2 weeks.  If still having pain after 2 weeks, complete 3rd-week of meloxicam. May use remaining meloxicam as needed once daily for pain control.  Do not to use additional NSAIDs while taking meloxicam.  May use Tylenol [phone] mg 2 to 3 times a day for breakthrough pain. Glute HEP  As needed follow up if no improvement 4 week follow up

## 2024-08-21 NOTE — Telephone Encounter (Signed)
 Teara from [facility] called stated pt has up coming appt. For order I put in for pt to have Ct Neck w/Con. She stated it need P/A. No P/A was needed per [facility] ins. on date order was put in and Call ref# for that was # [id number]. Todays call ref # is [id number], no P/A is required.
